# Patient Record
Sex: Female | Born: 1960 | Smoking: Former smoker
Health system: Southern US, Community
[De-identification: ages and names within clinical notes are randomized; demographics above are authoritative.]

## PROBLEM LIST (undated history)

## (undated) DIAGNOSIS — I10 Essential (primary) hypertension: Secondary | ICD-10-CM

## (undated) DIAGNOSIS — E119 Type 2 diabetes mellitus without complications: Secondary | ICD-10-CM

## (undated) DIAGNOSIS — N189 Chronic kidney disease, unspecified: Secondary | ICD-10-CM

## (undated) DIAGNOSIS — I509 Heart failure, unspecified: Secondary | ICD-10-CM

## (undated) HISTORY — DX: Heart failure, unspecified: I50.9

---

## 2020-01-01 ENCOUNTER — Ambulatory Visit: Payer: Self-pay | Attending: Internal Medicine

## 2020-01-01 DIAGNOSIS — Z23 Encounter for immunization: Secondary | ICD-10-CM

## 2020-01-01 NOTE — Progress Notes (Addendum)
   Covid-19 Vaccination Clinic  Name:  Caroline Walls    MRN: [id number] DOB: May 10, 1961  01/01/2020  [name] was observed post Covid-19 immunization for 15 minutes without incident. Vaccine is verified that it was given. She was provided with Vaccine Information Sheet and instruction to access the V-Safe system.   [name] was instructed to call 911 with any severe reactions post vaccine: Marland Kitchen Difficulty breathing  . Swelling of face and throat  . A fast heartbeat  . A bad rash all over body  . Dizziness and weakness   Immunizations Administered    Name Date Dose VIS Date Route   Pfizer COVID-19 Vaccine 01/01/2020 12:50 PM 0.3 mL 09/26/2019 Intramuscular   Manufacturer: [place]   Lot: [id number]   [place]: [id number]

## 2020-01-27 ENCOUNTER — Ambulatory Visit: Payer: Self-pay | Attending: Internal Medicine

## 2020-01-27 DIAGNOSIS — Z23 Encounter for immunization: Secondary | ICD-10-CM

## 2020-01-27 NOTE — Progress Notes (Signed)
   Covid-19 Vaccination Clinic  Name:  Caroline Walls    MRN: [id number] DOB: July 01, 1961  01/27/2020  [name] was observed post Covid-19 immunization for 15 minutes without incident. She was provided with Vaccine Information Sheet and instruction to access the V-Safe system.   [name] was instructed to call 911 with any severe reactions post vaccine: Marland Kitchen Difficulty breathing  . Swelling of face and throat  . A fast heartbeat  . A bad rash all over body  . Dizziness and weakness   Immunizations Administered    Name Date Dose VIS Date Route   Pfizer COVID-19 Vaccine 01/27/2020 12:34 PM 0.3 mL 09/26/2019 Intramuscular   Manufacturer: [place]   Lot: [id number]   [place]: [id number]

## 2020-08-05 ENCOUNTER — Other Ambulatory Visit (HOSPITAL_COMMUNITY): Payer: Self-pay | Admitting: Internal Medicine

## 2020-08-05 ENCOUNTER — Other Ambulatory Visit: Payer: Self-pay | Admitting: Internal Medicine

## 2020-08-05 DIAGNOSIS — R1012 Left upper quadrant pain: Secondary | ICD-10-CM

## 2020-08-16 ENCOUNTER — Ambulatory Visit: Admission: RE | Admit: 2020-08-16 | Payer: PRIVATE HEALTH INSURANCE | Source: Ambulatory Visit

## 2020-08-18 ENCOUNTER — Other Ambulatory Visit: Payer: Self-pay

## 2020-08-18 ENCOUNTER — Ambulatory Visit
Admission: RE | Admit: 2020-08-18 | Discharge: 2020-08-18 | Disposition: A | Discharge status: Home / Self Care | Payer: PRIVATE HEALTH INSURANCE | Source: Ambulatory Visit | Attending: Internal Medicine | Admitting: Internal Medicine

## 2020-08-18 DIAGNOSIS — R1012 Left upper quadrant pain: Secondary | ICD-10-CM | POA: Diagnosis present

## 2020-11-03 ENCOUNTER — Inpatient Hospital Stay
Admission: EM | Admit: 2020-11-03 | Discharge: 2020-11-05 | DRG: 291 | Disposition: A | Discharge status: Home Health | Payer: PRIVATE HEALTH INSURANCE | Attending: Internal Medicine | Admitting: Internal Medicine

## 2020-11-03 ENCOUNTER — Other Ambulatory Visit: Payer: Self-pay

## 2020-11-03 ENCOUNTER — Inpatient Hospital Stay: Payer: PRIVATE HEALTH INSURANCE

## 2020-11-03 ENCOUNTER — Emergency Department: Payer: PRIVATE HEALTH INSURANCE

## 2020-11-03 ENCOUNTER — Encounter: Payer: Self-pay | Admitting: *Deleted

## 2020-11-03 DIAGNOSIS — I248 Other forms of acute ischemic heart disease: Secondary | ICD-10-CM | POA: Diagnosis present

## 2020-11-03 DIAGNOSIS — I5023 Acute on chronic systolic (congestive) heart failure: Secondary | ICD-10-CM | POA: Diagnosis present

## 2020-11-03 DIAGNOSIS — J9600 Acute respiratory failure, unspecified whether with hypoxia or hypercapnia: Secondary | ICD-10-CM | POA: Diagnosis not present

## 2020-11-03 DIAGNOSIS — E785 Hyperlipidemia, unspecified: Secondary | ICD-10-CM | POA: Diagnosis present

## 2020-11-03 DIAGNOSIS — I130 Hypertensive heart and chronic kidney disease with heart failure and stage 1 through stage 4 chronic kidney disease, or unspecified chronic kidney disease: Principal | ICD-10-CM | POA: Diagnosis present

## 2020-11-03 DIAGNOSIS — N184 Chronic kidney disease, stage 4 (severe): Secondary | ICD-10-CM | POA: Diagnosis present

## 2020-11-03 DIAGNOSIS — E279 Disorder of adrenal gland, unspecified: Secondary | ICD-10-CM | POA: Diagnosis present

## 2020-11-03 DIAGNOSIS — Z79899 Other long term (current) drug therapy: Secondary | ICD-10-CM | POA: Diagnosis not present

## 2020-11-03 DIAGNOSIS — J189 Pneumonia, unspecified organism: Secondary | ICD-10-CM

## 2020-11-03 DIAGNOSIS — J9601 Acute respiratory failure with hypoxia: Secondary | ICD-10-CM | POA: Diagnosis present

## 2020-11-03 DIAGNOSIS — Z87891 Personal history of nicotine dependence: Secondary | ICD-10-CM

## 2020-11-03 DIAGNOSIS — R0602 Shortness of breath: Secondary | ICD-10-CM | POA: Diagnosis present

## 2020-11-03 DIAGNOSIS — Z7984 Long term (current) use of oral hypoglycemic drugs: Secondary | ICD-10-CM

## 2020-11-03 DIAGNOSIS — E1122 Type 2 diabetes mellitus with diabetic chronic kidney disease: Secondary | ICD-10-CM | POA: Diagnosis present

## 2020-11-03 DIAGNOSIS — Z20822 Contact with and (suspected) exposure to covid-19: Secondary | ICD-10-CM | POA: Diagnosis present

## 2020-11-03 DIAGNOSIS — I509 Heart failure, unspecified: Secondary | ICD-10-CM

## 2020-11-03 DIAGNOSIS — I10 Essential (primary) hypertension: Secondary | ICD-10-CM | POA: Diagnosis not present

## 2020-11-03 HISTORY — DX: Type 2 diabetes mellitus without complications: E11.9

## 2020-11-03 HISTORY — DX: Chronic kidney disease, unspecified: N18.9

## 2020-11-03 HISTORY — DX: Essential (primary) hypertension: I10

## 2020-11-03 LAB — CBC WITH DIFFERENTIAL/PLATELET
Abs Immature Granulocytes: 0.18 10*3/uL — ABNORMAL HIGH (ref 0.00–0.07)
Basophils Absolute: 0 10*3/uL (ref 0.0–0.1)
Basophils Relative: 0 %
Eosinophils Absolute: 0.2 10*3/uL (ref 0.0–0.5)
Eosinophils Relative: 1 %
HCT: 33.8 % — ABNORMAL LOW (ref 36.0–46.0)
Hemoglobin: 11.2 g/dL — ABNORMAL LOW (ref 12.0–15.0)
Immature Granulocytes: 1 %
Lymphocytes Relative: 12 %
Lymphs Abs: 2.1 10*3/uL (ref 0.7–4.0)
MCH: 29.4 pg (ref 26.0–34.0)
MCHC: 33.1 g/dL (ref 30.0–36.0)
MCV: 88.7 fL (ref 80.0–100.0)
Monocytes Absolute: 1.2 10*3/uL — ABNORMAL HIGH (ref 0.1–1.0)
Monocytes Relative: 7 %
Neutro Abs: 13 10*3/uL — ABNORMAL HIGH (ref 1.7–7.7)
Neutrophils Relative %: 79 %
Platelets: 264 10*3/uL (ref 150–400)
RBC: 3.81 MIL/uL — ABNORMAL LOW (ref 3.87–5.11)
RDW: 15.5 % (ref 11.5–15.5)
WBC: 16.6 10*3/uL — ABNORMAL HIGH (ref 4.0–10.5)
nRBC: 0 % (ref 0.0–0.2)

## 2020-11-03 LAB — BASIC METABOLIC PANEL
Anion gap: 17 — ABNORMAL HIGH (ref 5–15)
BUN: 33 mg/dL — ABNORMAL HIGH (ref 8–23)
CO2: 22 mmol/L (ref 22–32)
Calcium: 8.8 mg/dL — ABNORMAL LOW (ref 8.9–10.3)
Chloride: 104 mmol/L (ref 98–111)
Creatinine, Ser: 2.05 mg/dL — ABNORMAL HIGH (ref 0.44–1.00)
GFR, Estimated: 27 mL/min — ABNORMAL LOW (ref >60)
Glucose, Bld: 228 mg/dL — ABNORMAL HIGH (ref 70–99)
Potassium: 3.7 mmol/L (ref 3.5–5.1)
Sodium: 143 mmol/L (ref 135–145)

## 2020-11-03 LAB — TROPONIN I (HIGH SENSITIVITY)
Troponin I (High Sensitivity): 47 ng/L — ABNORMAL HIGH (ref <18)
Troponin I (High Sensitivity): 93 ng/L — ABNORMAL HIGH (ref <18)
Troponin I (High Sensitivity): 116 ng/L (ref <18)
Troponin I (High Sensitivity): 125 ng/L (ref <18)

## 2020-11-03 LAB — CBG MONITORING, ED
Glucose-Capillary: 288 mg/dL — ABNORMAL HIGH (ref 70–99)
Glucose-Capillary: 202 mg/dL — ABNORMAL HIGH (ref 70–99)

## 2020-11-03 LAB — LACTIC ACID, PLASMA: Lactic Acid, Venous: 1.4 mmol/L (ref 0.5–1.9)

## 2020-11-03 LAB — HEMOGLOBIN A1C
Hgb A1c MFr Bld: 6 % — ABNORMAL HIGH (ref 4.8–5.6)
Mean Plasma Glucose: 125.5 mg/dL

## 2020-11-03 LAB — RESP PANEL BY RT-PCR (FLU A&B, COVID) ARPGX2
Influenza A by PCR: NEGATIVE
Influenza B by PCR: NEGATIVE
SARS Coronavirus 2 by RT PCR: NEGATIVE

## 2020-11-03 LAB — HIV ANTIBODY (ROUTINE TESTING W REFLEX): HIV Screen 4th Generation wRfx: NONREACTIVE

## 2020-11-03 LAB — PROCALCITONIN: Procalcitonin: <0.1 ng/mL

## 2020-11-03 LAB — BRAIN NATRIURETIC PEPTIDE: B Natriuretic Peptide: 919.9 pg/mL — ABNORMAL HIGH (ref 0.0–100.0)

## 2020-11-03 MED ORDER — HYDRALAZINE HCL 50 MG PO TABS
100.0000 mg | ORAL_TABLET | Freq: Two times a day (BID) | ORAL | Status: DC
  Administered 2020-11-03 – 2020-11-04 (×2): 100 mg via ORAL
  Filled 2020-11-03 (×2): qty 2

## 2020-11-03 MED ORDER — BENAZEPRIL HCL 20 MG PO TABS
40.0000 mg | ORAL_TABLET | Freq: Every day | ORAL | Status: DC
  Administered 2020-11-03 – 2020-11-05 (×3): 40 mg via ORAL
  Filled 2020-11-03 (×3): qty 2

## 2020-11-03 MED ORDER — ONDANSETRON HCL 4 MG/2ML IJ SOLN: 4.0000 mg | Freq: Four times a day (QID) | INTRAMUSCULAR | Status: DC | PRN

## 2020-11-03 MED ORDER — SODIUM CHLORIDE 0.9% FLUSH
3.0000 mL | Freq: Two times a day (BID) | INTRAVENOUS | Status: DC
  Administered 2020-11-03 – 2020-11-05 (×4): 3 mL via INTRAVENOUS

## 2020-11-03 MED ORDER — ENOXAPARIN SODIUM 30 MG/0.3ML ~~LOC~~ SOLN
30.0000 mg | SUBCUTANEOUS | Status: DC
  Administered 2020-11-04 – 2020-11-05 (×3): 30 mg via SUBCUTANEOUS
  Filled 2020-11-03 (×6): qty 0.3

## 2020-11-03 MED ORDER — LORAZEPAM 1 MG PO TABS: 1.0000 mg | ORAL_TABLET | Freq: Once | ORAL | Status: DC

## 2020-11-03 MED ORDER — AMLODIPINE BESY-BENAZEPRIL HCL 10-40 MG PO CAPS: 1.0000 | ORAL_CAPSULE | Freq: Every day | ORAL | Status: DC

## 2020-11-03 MED ORDER — SODIUM CHLORIDE 0.9% FLUSH
3.0000 mL | INTRAVENOUS | Status: DC | PRN
  Administered 2020-11-05: 3 mL via INTRAVENOUS

## 2020-11-03 MED ORDER — LORAZEPAM 2 MG/ML IJ SOLN: 1.0000 mg | Freq: Once | INTRAMUSCULAR | Status: DC

## 2020-11-03 MED ORDER — FUROSEMIDE 10 MG/ML IJ SOLN
40.0000 mg | Freq: Once | INTRAMUSCULAR | Status: AC
  Administered 2020-11-03: 40 mg via INTRAVENOUS
  Filled 2020-11-03: qty 4

## 2020-11-03 MED ORDER — INSULIN ASPART 100 UNIT/ML ~~LOC~~ SOLN
0.0000 [IU] | Freq: Three times a day (TID) | SUBCUTANEOUS | Status: DC
  Administered 2020-11-03: 8 [IU] via SUBCUTANEOUS
  Administered 2020-11-03 – 2020-11-04 (×2): 5 [IU] via SUBCUTANEOUS
  Administered 2020-11-04: 2 [IU] via SUBCUTANEOUS
  Administered 2020-11-04 – 2020-11-05 (×3): 3 [IU] via SUBCUTANEOUS
  Filled 2020-11-03 (×7): qty 1

## 2020-11-03 MED ORDER — FUROSEMIDE 10 MG/ML IJ SOLN
40.0000 mg | Freq: Two times a day (BID) | INTRAMUSCULAR | Status: DC
  Administered 2020-11-03 – 2020-11-05 (×4): 40 mg via INTRAVENOUS
  Filled 2020-11-03 (×4): qty 4

## 2020-11-03 MED ORDER — SODIUM CHLORIDE 0.9 % IV SOLN
1.0000 g | Freq: Once | INTRAVENOUS | Status: AC
  Administered 2020-11-03: 1 g via INTRAVENOUS
  Filled 2020-11-03: qty 10

## 2020-11-03 MED ORDER — METOPROLOL SUCCINATE ER 100 MG PO TB24
100.0000 mg | ORAL_TABLET | Freq: Every day | ORAL | Status: DC
  Administered 2020-11-03 – 2020-11-05 (×3): 100 mg via ORAL
  Filled 2020-11-03: qty 1
  Filled 2020-11-03 (×2): qty 2

## 2020-11-03 MED ORDER — SODIUM CHLORIDE 0.9 % IV SOLN: 250.0000 mL | INTRAVENOUS | Status: DC | PRN

## 2020-11-03 MED ORDER — SODIUM CHLORIDE 0.9 % IV SOLN
500.0000 mg | Freq: Once | INTRAVENOUS | Status: AC
  Administered 2020-11-03: 500 mg via INTRAVENOUS
  Filled 2020-11-03: qty 500

## 2020-11-03 MED ORDER — ACETAMINOPHEN 325 MG PO TABS: 650.0000 mg | ORAL_TABLET | ORAL | Status: DC | PRN

## 2020-11-03 NOTE — ED Notes (Signed)
Informed Dr Francine Graven of troponin level 116. MRI on phone with pt.

## 2020-11-03 NOTE — Consult Note (Signed)
CARDIOLOGY CONSULT NOTE               Patient ID: Caroline Walls MRN: [id number] DOB/AGE: 1960-10-27 60 y.o.  Admit date: 11/03/2020 Referring Physician: Collier Bullock, MD  Primary Physician: Gladstone Lighter, MD  Primary Cardiologist: Isaias Cowman, MD  Reason for Consultation: CHF with systolic dysfunction and reduced EF  HPI: Caroline Walls is a 60 year old female with a PMH significant for newly diagnosed systolic CHF(EF of [id number]), HTN, HLD, DM Type II and CKD stage IIIa who presented to [facility] ED via EMS due to complaints of dyspnea. The patient was found to be fluid volume overloaded and cardiology was consulted for CHF management.   ED course: The patient arrived via EMS with c/o dyspnea and peripheral edema. BNP was elevated at 919.9, high sensitivity troponin was elevated at 74>>93, CXR remarkable for cardiomegaly and pulmonary opacification and ECG reveals NSR without evidence of ST changes. The patient was given IV furosemide for diuresis.  The patient reports that the dyspnea started 2 night ago when she noticed that she was "panting" and "gasping" for air during exertion. She reports that she was unable to sleep lying flat due to orthopnea and kept having to sit up in order to breathe effectively. The patient reports an associated symptom of cough with yellow sputum production. Additionally, the patient reports that she frequently experiences BLE edema because she "sits for long periods of time due to work." She reports that she was recently seen by Dr. Saralyn Pilar back in December of 2021 to establish care and for the new diagnosis of CHF. The patient reports that she has been compliant with all of her current medications; however, she has been switched between 2 different diuretics by her PCP because "they weren't working."    Review of systems complete and found to be negative unless listed above     Past Medical History:  Diagnosis Date  . Chronic kidney disease   .  Diabetes mellitus without complication ([place])   . Hypertension     History reviewed. No pertinent surgical history.  (Not in a hospital admission)  Social History   Socioeconomic History  . Marital status: Unknown    Spouse name: Not on file  . Number of children: Not on file  . Years of education: Not on file  . Highest education level: Not on file  Occupational History  . Not on file  Tobacco Use  . Smoking status: Former Research scientist (life sciences)  . Smokeless tobacco: Never Used  Substance and Sexual Activity  . Alcohol use: Not Currently  . Drug use: Not Currently  . Sexual activity: Not Currently  Other Topics Concern  . Not on file  Social History Narrative  . Not on file   Social Determinants of Health   Financial Resource Strain: Not on file  Food Insecurity: Not on file  Transportation Needs: Not on file  Physical Activity: Not on file  Stress: Not on file  Social Connections: Not on file  Intimate Partner Violence: Not on file    Family History  Problem Relation Age of Onset  . Heart failure Brother   . Heart failure Other       Review of systems complete and found to be negative unless listed above      PHYSICAL EXAM  General: Well developed, well nourished, in no acute distress HEENT:  Normocephalic and atraumatic. PERRL Neck:  No JVD.  Lungs: Rhonchi bilaterally to auscultation. Chest expanison symmetrical. Negative for wheezing.  Heart:  HRRR . Normal S1 and S2 without gallops or murmurs.  Abdomen: Bowel sounds are positive, abdomen soft and non-tender  Msk: Normal strength and tone for age. Extremities: +2 pitting BLE edema. No clubbing or cyanosis.   Neuro: Alert and oriented X 3. Psych:  Good affect, responds appropriately  Labs:   Lab Results  Component Value Date   WBC 16.6 (H) 11/03/2020   HGB 11.2 (L) 11/03/2020   HCT 33.8 (L) 11/03/2020   MCV 88.7 11/03/2020   PLT 264 11/03/2020    Recent Labs  Lab 11/03/20 0514  NA 143  K 3.7  CL 104   CO2 22  BUN 33*  CREATININE 2.05*  CALCIUM 8.8*  GLUCOSE 228*   No results found for: CKTOTAL, CKMB, CKMBINDEX, TROPONINI No results found for: CHOL No results found for: HDL No results found for: LDLCALC No results found for: TRIG No results found for: CHOLHDL No results found for: LDLDIRECT    Radiology: CT CHEST WO CONTRAST  Result Date: 11/03/2020 CLINICAL DATA:  Shortness of breath.  Pleural effusions. EXAM: CT CHEST WITHOUT CONTRAST TECHNIQUE: Multidetector CT imaging of the chest was performed following the standard protocol without IV contrast. COMPARISON:  None. FINDINGS: Cardiovascular: No acute findings. Aortic atherosclerotic calcification noted. Mediastinum/Nodes: Diffusely enlarged and heterogeneous appearance of the thyroid gland is demonstrated. This likely represents a multinodular goiter, although thyroid nodules are difficult to evaluate on this unenhanced exam. No other masses or pathologically enlarged lymph nodes identified on this unenhanced exam. Lungs/Pleura: Small bilateral pleural effusions are seen with associated compressive atelectasis in both lung bases. Asymmetric diffuse ground-glass opacity is seen involving the right lung greater than left. This may be due to asymmetric edema or atypical infectious or inflammatory process. No suspicious pulmonary nodules or masses are identified. No evidence of central endobronchial obstruction. Upper Abdomen: Moderate hiatal hernia is seen. A complex lesion is seen in the lateral upper pole of the right kidney which measures 3.3 cm, but cannot be characterized on this unenhanced exam. A probable small left adrenal mass is seen measuring 1.3 cm, also nonspecific. Musculoskeletal:  No suspicious bone lesions. IMPRESSION: Asymmetric diffuse ground-glass opacity in right lung greater than left, which may be due to asymmetric edema, or atypical infectious or inflammatory process. No evidence of pulmonary mass or central endobronchial  obstruction. Small bilateral pleural effusions with bibasilar atelectasis. Moderate hiatal hernia. Diffusely enlarged and heterogeneous appearance of thyroid gland. Recommend thyroid ultrasound. (Ref: J Am Coll Radiol. 2015 Feb;12(2): 143-50). Indeterminate right renal lesion and probable small left adrenal mass. Abdomen MRI without and with contrast is recommended for further characterization. Aortic Atherosclerosis (ICD10-I70.0). Electronically Signed   By: Marlaine Hind M.D.   On: 11/03/2020 09:32   DG Chest Portable 1 View  Result Date: 11/03/2020 CLINICAL DATA:  Shortness of breath EXAM: PORTABLE CHEST 1 VIEW COMPARISON:  None available FINDINGS: Cardiomegaly. Symmetric interstitial opacity with cephalized blood flow and probable small pleural effusions. There is streaky density at both lung bases. Negative aortic and hilar contours IMPRESSION: Cardiomegaly and pulmonary opacification, favor CHF over infection. Electronically Signed   By: Monte Fantasia M.D.   On: 11/03/2020 06:00    EKG: NSR without ST changes  ECHO: (09/01/2020) INTERPRETATION  MODERATE LV SYSTOLIC DYSFUNCTION (See above)  NORMAL RIGHT VENTRICULAR SYSTOLIC FUNCTION  MILD VALVULAR REGURGITATION (See above)  NO VALVULAR STENOSIS  Elevated pulmonary pressure Closest EF: 35% (Estimated)    ASSESSMENT AND PLAN:  Caroline Walls is a 60 year old female with  a PMH significant for newly diagnosed systolic CHF(EF of [id number]), HTN, HLD, DM Type II and CKD stage IIIa who presented to [facility] ED via EMS due to complaints of dyspnea likely due to CHF exacerbation. The patient has an elevated BNP, CXR findings consistent with CHF and elevated high sensitivity troponin which is likely due to demand ischemia. The patient has been on ACEi, beta blocker and diuretic therapy as outpatient and is scheduled to undergo a stress test as an outpatient next month. The patient's compliance with a low sodium diet and medications are questionable, as the patient  was reluctant to start many of the medications that were suggested due to potential side effects that she has researched on her own. Moderate diuresis in warranted at this time given the patient's current symptoms, reduced EF and presence of CKD. Bronchitis could also be a contributing factor to the patient's dyspnea as she was previously treated with Zithromax as a outpatient with minimal resolution of her symptoms.   1. CHF systolic with reduced EF (35%), uncompensated, fairly stable, confirmed by Echocardiogram from 08/2020, BNP elevated  -Agree with admission to Telemetry unit.   -Continue IV diuresis with strict monitoring of I&O's and daily weights.  -ACEi and beta blocker.  -Recommend transitioning to entresto as outpatient OR starting isosorbide therapy along with her current hydralazine.   -Continuous telemetry monitoring.   2. Elevated troponin, likely due to demand ischemia, patient denies any angina at this time, ECG negative for ST changes  -Recommend outpatient Lexiscan/Myoview   -Continuous telemetry monitoring.   -Trend high sensitivity troponin  3. HTN, failry controlled  -Agree with metoprolol, hydralazine and benazipril therapy.  -Start heart healthy, low sodium diet.   -Monitor BP per protocol.  4. HLD, stable  -continue crestor therapy per outpatient medication reconciliation.  5. DM type II, fairly stable  -Agree with management with routine CBG monitoring and sliding scale insulin per protocol.  6. CKD Stage IIIa, fairly stable  -Recommending trending CMP.  -Recommend moderate IV diuresis and consider Nephrology referral if BUN/creatinine worsens.   7. Acute respiratory failure, likely due to CHF exacerbation, fairly stable, patient is on room air with 96% oxygen saturation  -Agree with current management.  - Agree with antibiotic therapy for possible pneumonia.  -Blood cultures pending.     Signed: Burnice Oestreicher ACNPC-AG 11/03/2020, 12:05 PM

## 2020-11-03 NOTE — ED Notes (Signed)
Pt given dose of lasix and disconnected from monitor to walk to toilet.

## 2020-11-03 NOTE — ED Notes (Signed)
Pt to MRI via gurney at this time.

## 2020-11-03 NOTE — ED Notes (Signed)
Lab at bedside

## 2020-11-03 NOTE — ED Notes (Signed)
MD at bedside. 

## 2020-11-03 NOTE — ED Notes (Signed)
PT back from MRI via gurney at this time.  No acute changes or s/s of distress.  PT tolerated well.  Placed back on bedside cardiac monitor/reasses

## 2020-11-03 NOTE — ED Notes (Signed)
CSW at bedside discussing home health.

## 2020-11-03 NOTE — ED Notes (Signed)
This RN stuck for blood cultures x2. Lab contacted to collect and reports unable to come at this time, [employer] aware. Dr Archie Balboa aware. Pt refuses to be stuck again at this time.

## 2020-11-03 NOTE — ED Notes (Signed)
Dr bradler notified of trop 98, no new orders at this time

## 2020-11-03 NOTE — ED Triage Notes (Signed)
Pt to ED via EMS from home reporting SOB x 3 days that worsened tonight. Upon EMS arrival to pts home oxygen saturation on RA was 48%. 1 duoneb, 1 albuterol, '125mg'$  of Solumedrol and NRB in route and pt arrived to ED with an oxygen saturation of 94%. PT able to speak in complete sentences. No CP. Cough when laying down and +1 pitting edema noted to both right and left legs. Pt reporting her PCP has been checking her for CHF recently.

## 2020-11-03 NOTE — H&P (Signed)
History and Physical    Caroline Walls [id number] DOB: 09-01-1961 DOA: 11/03/2020  PCP: Caroline Lighter, MD   Patient coming from: Home  I have personally briefly reviewed patient's old medical records in [place]  Chief Complaint: Shortness of breath  HPI: Caroline Walls is a 60 y.o. 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  Patient has had exertional shortness of breath for about 3 days that worsened the night prior to her presentation.  She has a cough which is occasionally productive of yellow phlegm as well as bilateral lower extremity swelling.  She was seen by her primary care provider who was concerned that she may have CHF and is being worked up for same. Per EMS patient had room air pulse oximetry of 48% and required oxygen supplementation with a nonrebreather mask to maintain pulse oximetry greater than 92%.  She received DuoNeb and Solu-Medrol 125 mg prior to presenting to the ER. She denies having any chest pain, no nausea, no vomiting, no dizziness, no lightheadedness, no abdominal pain, no fever, no chills, no constipation, no diarrhea, no urinary frequency, no nocturia, no dysuria, no headaches or any mental status changes. Labs show sodium 143, potassium 3.7, chloride 104, bicarb 22, glucose 228, BUN 33, creatinine 2.05, BNP 919, troponin  47 >> 93, procalcitonin less than 0.10, white count 16.6, hemoglobin 11.2, hematocrit 33.8, MCV 88, RDW 15.5, platelet count 264 Respiratory viral panel is negative Chest x-ray reviewed by me shows cardiomegaly and pulmonary opacification favor CHF of infection. CT scan of the chest without contrast shows  asymmetric diffuse ground-glass opacity in right lung greater than left, which may be due to asymmetric edema, or atypical infectious or  inflammatory process. No evidence of pulmonary mass or central endobronchial obstruction. Small bilateral pleural effusions with bibasilar atelectasis. Moderate hiatal hernia. Diffusely enlarged and heterogeneous appearance of thyroid gland. Recommend thyroid ultrasound. Indeterminate right renal lesion and probable small left adrenal mass. Abdomen MRI without and with contrast is recommended for further characterization. Aortic Atherosclerosis  Twelve-lead EKG reviewed by me shows normal sinus rhythm with widening of the QRS.   ED Course: Patient is a 60 year old African-American female who presents to the ER via EMS for evaluation of worsening shortness of breath.  She was hypoxic in the field with room air pulse oximetry in the 40s and required a nonrebreather mask to maintain pulse oximetry greater than 92%.  She received Solu-Medrol in the field as well as a dose of Zithromax and Rocephin for presumed pneumonia.  Imaging was suggestive of CHF and patient had a 2D echocardiogram done as an outpatient which showed LVEF of 35%.  She will be admitted to the hospital for further evaluation   Review of Systems: As per HPI otherwise all systems reviewed and negative.   Past Medical History:  Diagnosis Date  . Chronic kidney disease   . Diabetes mellitus without complication ([place])   . Hypertension     History reviewed. No pertinent surgical history.   reports that she has quit smoking. She has never used smokeless tobacco. She reports previous alcohol use. She reports previous drug use.  Allergies  Allergen Reactions  . Penicillins Other (See Comments)    Patient get's a yeast infection every time she takes medication. Other reaction(s): Other (See Comments) Patient get's a yeast infection every time  she takes medication.     Family History  Problem Relation Age of Onset  . Heart failure Brother   . Heart failure Other      Prior to Admission medications   Medication Sig  Start Date End Date Taking? Authorizing Provider  amLODipine-benazepril (LOTREL) 10-40 MG capsule Take 1 capsule by mouth daily. 10/13/20  Yes [provider]  furosemide (LASIX) 40 MG tablet Take 40 mg by mouth daily. 09/03/20  Yes [provider]  glipiZIDE (GLUCOTROL XL) 2.5 MG 24 hr tablet Take 2.5 mg by mouth daily. 10/03/20  Yes [provider]  hydrALAZINE (APRESOLINE) 100 MG tablet Take 100 mg by mouth 2 (two) times daily. 09/19/20  Yes [provider]  meloxicam (MOBIC) 15 MG tablet Take 15 mg by mouth daily as needed. 07/22/20  Yes [provider]  metoprolol succinate (TOPROL-XL) 100 MG 24 hr tablet Take 100 mg by mouth daily. 10/27/20  Yes [provider]    Physical Exam: Vitals:   11/03/20 0510 11/03/20 0600 11/03/20 0630 11/03/20 0800  BP: (!) 157/70 (!) 144/60 (!) 146/66 (!) 165/74  Pulse: 80 82 71 77  Resp: 20 (!) 26 (!) 22   Temp: (!) 97.4 F (36.3 C)     TempSrc: Oral     SpO2: 96% 98% 95% 96%  Weight:      Height:         Vitals:   11/03/20 0510 11/03/20 0600 11/03/20 0630 11/03/20 0800  BP: (!) 157/70 (!) 144/60 (!) 146/66 (!) 165/74  Pulse: 80 82 71 77  Resp: 20 (!) 26 (!) 22   Temp: (!) 97.4 F (36.3 C)     TempSrc: Oral     SpO2: 96% 98% 95% 96%  Weight:      Height:        Constitutional: NAD, alert and oriented x 3 Eyes: PERRL, lids and conjunctivae normal ENMT: Mucous membranes are moist.  Neck: normal, supple, no masses, no thyromegaly Respiratory: Rales at the bases, no wheezing. Normal respiratory effort. No accessory muscle use.  Cardiovascular: Regular rate and rhythm, no murmurs / rubs / gallops. 1+ extremity edema. 2+ pedal pulses. No carotid bruits.  Abdomen: no tenderness, no masses palpated. No hepatosplenomegaly. Bowel sounds positive.  Musculoskeletal: no clubbing / cyanosis. No joint deformity upper and lower extremities.  Skin: no rashes, lesions, ulcers.  Neurologic: No gross  focal neurologic deficit. Psychiatric: Normal mood and affect.   Labs on Admission: I have personally reviewed following labs and imaging studies  CBC: Recent Labs  Lab 11/03/20 0514  WBC 16.6*  NEUTROABS 13.0*  HGB 11.2*  HCT 33.8*  MCV 88.7  PLT [id number]   Basic Metabolic Panel: Recent Labs  Lab 11/03/20 0514  NA 143  K 3.7  CL 104  CO2 22  GLUCOSE 228*  BUN 33*  CREATININE 2.05*  CALCIUM 8.8*   GFR: Estimated Creatinine Clearance: 28 mL/min (A) (by C-G formula based on SCr of 2.05 mg/dL (H)). Liver Function Tests: No results for input(s): AST, ALT, ALKPHOS, BILITOT, PROT, ALBUMIN in the last 168 hours. No results for input(s): LIPASE, AMYLASE in the last 168 hours. No results for input(s): AMMONIA in the last 168 hours. Coagulation Profile: No results for input(s): INR, PROTIME in the last 168 hours. Cardiac Enzymes: No results for input(s): CKTOTAL, CKMB, CKMBINDEX, TROPONINI in the last 168 hours. BNP (last 3 results) No results for input(s): PROBNP in the last 8760 hours. HbA1C: No results for  input(s): HGBA1C in the last 72 hours. CBG: No results for input(s): GLUCAP in the last 168 hours. Lipid Profile: No results for input(s): CHOL, HDL, LDLCALC, TRIG, CHOLHDL, LDLDIRECT in the last 72 hours. Thyroid Function Tests: No results for input(s): TSH, T4TOTAL, FREET4, T3FREE, THYROIDAB in the last 72 hours. Anemia Panel: No results for input(s): VITAMINB12, FOLATE, FERRITIN, TIBC, IRON, RETICCTPCT in the last 72 hours. Urine analysis: No results found for: COLORURINE, APPEARANCEUR, LABSPEC, [place], GLUCOSEU, HGBUR, BILIRUBINUR, KETONESUR, PROTEINUR, UROBILINOGEN, NITRITE, LEUKOCYTESUR  Radiological Exams on Admission: CT CHEST WO CONTRAST  Result Date: 11/03/2020 CLINICAL DATA:  Shortness of breath.  Pleural effusions. EXAM: CT CHEST WITHOUT CONTRAST TECHNIQUE: Multidetector CT imaging of the chest was performed following the standard protocol without IV  contrast. COMPARISON:  None. FINDINGS: Cardiovascular: No acute findings. Aortic atherosclerotic calcification noted. Mediastinum/Nodes: Diffusely enlarged and heterogeneous appearance of the thyroid gland is demonstrated. This likely represents a multinodular goiter, although thyroid nodules are difficult to evaluate on this unenhanced exam. No other masses or pathologically enlarged lymph nodes identified on this unenhanced exam. Lungs/Pleura: Small bilateral pleural effusions are seen with associated compressive atelectasis in both lung bases. Asymmetric diffuse ground-glass opacity is seen involving the right lung greater than left. This may be due to asymmetric edema or atypical infectious or inflammatory process. No suspicious pulmonary nodules or masses are identified. No evidence of central endobronchial obstruction. Upper Abdomen: Moderate hiatal hernia is seen. A complex lesion is seen in the lateral upper pole of the right kidney which measures 3.3 cm, but cannot be characterized on this unenhanced exam. A probable small left adrenal mass is seen measuring 1.3 cm, also nonspecific. Musculoskeletal:  No suspicious bone lesions. IMPRESSION: Asymmetric diffuse ground-glass opacity in right lung greater than left, which may be due to asymmetric edema, or atypical infectious or inflammatory process. No evidence of pulmonary mass or central endobronchial obstruction. Small bilateral pleural effusions with bibasilar atelectasis. Moderate hiatal hernia. Diffusely enlarged and heterogeneous appearance of thyroid gland. Recommend thyroid ultrasound. (Ref: J Am Coll Radiol. 2015 Feb;12(2): 143-50). Indeterminate right renal lesion and probable small left adrenal mass. Abdomen MRI without and with contrast is recommended for further characterization. Aortic Atherosclerosis (ICD10-I70.0). Electronically Signed   By: Marlaine Hind M.D.   On: 11/03/2020 09:32   DG Chest Portable 1 View  Result Date:  11/03/2020 CLINICAL DATA:  Shortness of breath EXAM: PORTABLE CHEST 1 VIEW COMPARISON:  None available FINDINGS: Cardiomegaly. Symmetric interstitial opacity with cephalized blood flow and probable small pleural effusions. There is streaky density at both lung bases. Negative aortic and hilar contours IMPRESSION: Cardiomegaly and pulmonary opacification, favor CHF over infection. Electronically Signed   By: Monte Fantasia M.D.   On: 11/03/2020 06:00    EKG: Independently reviewed.  Sinus rhythm LVH with widening QRS.    Assessment/Plan Principal Problem:   Acute on chronic systolic CHF (congestive heart failure) (HCC) Active Problems:   Acute respiratory failure (HCC)   Type 2 diabetes mellitus with stage 4 chronic kidney disease (HCC)   Essential hypertension      Acute on chronic systolic heart failure Patient was brought into the ER via EMS for evaluation of worsening shortness of breath associated with bilateral lower extremity swelling She had a 2D echocardiogram done on 01/17 as an out patient which showed an LVEF of 35% Place patient on Lasix 40 mg IV every 12 Optimize blood pressure control Continue metoprolol, benazepril and hydralazine    Elevated troponin Most likely secondary  to demand ischemia from acute CHF Patient denies having any chest pain and EKG shows no acute findings We will trend cardiac enzymes Continue aspirin and Lovenox   Acute respiratory failure Most likely secondary to acute CHF Patient was noted to be hypoxic in the field with a room air pulse oximetry in the 40s associated with increased work of breathing and use of accessory muscles She initially required a nonrebreather mask but is currently on 6 L of oxygen via nasal cannula Continue oxygen supplementation to maintain pulse oximetry greater than 92% Wean off oxygen as tolerated    Diabetes mellitus with complications of stage IV chronic kidney disease Patient will need referral to  nephrology as an outpatient Maintain consistent carbohydrate diet Glycemic control with sliding scale insulin Hold glipizide during this hospitalization    Hypertension Continue metoprolol, benazepril and hydralazine    Right renal lesion/adrenal mass MRA with and without contrast was recommended Patient has chronic kidney disease and as such we will obtain MRA without contrast      DVT prophylaxis: Lovenox Code Status: Full code Family Communication: Greater than 50% of time was spent discussing patient's condition and plan of care with her at the bedside.  All questions and concerns have been addressed.  She verbalizes understanding and agrees with the plan. Disposition Plan: Back to previous home environment Consults called: Cardiology    Arlester Keehan MD [facility]     11/03/2020, 10:58 AM

## 2020-11-03 NOTE — ED Provider Notes (Signed)
[facility] Emergency Department Provider Note   ____________________________________________   I have reviewed the triage vital signs and the nursing notes.   HISTORY  Chief Complaint Shortness of Breath   History limited by: Not Limited   HPI Caroline Walls is a 60 y.o. female who presents to the emergency department today because of concerns for shortness of breath.  Patient states that she has darted becoming short of breath yesterday evening.  It progressed throughout the night to the point where it became hard for her to sleep.  The patient has not had any significant associated chest pain.  The patient has had some cough.  She denies any fevers.  Denies any recent covid exposure.    Records reviewed. Per medical record review patient has a history of CHF  History reviewed. No pertinent past medical history.  There are no problems to display for this patient.   History reviewed. No pertinent surgical history.  Prior to Admission medications   Not on File    Allergies Patient has no known allergies.  History reviewed. No pertinent family history.  Social History Social History   Tobacco Use  . Smoking status: Never Smoker  . Smokeless tobacco: Never Used  Substance Use Topics  . Alcohol use: Not Currently  . Drug use: Not Currently    Review of Systems Constitutional: No fever/chills Eyes: No visual changes. ENT: No sore throat. Cardiovascular: Denies chest pain. Respiratory: Positive for shortness of breath. Gastrointestinal: No abdominal pain.  No nausea, no vomiting.  No diarrhea.   Genitourinary: Negative for dysuria. Musculoskeletal: Negative for back pain. Skin: Negative for rash. Neurological: Negative for headaches, focal weakness or numbness.  ____________________________________________   PHYSICAL EXAM:  VITAL SIGNS: ED Triage Vitals  Enc Vitals Group     BP 11/03/20 0510 (!) 157/70     Pulse Rate 11/03/20 0510  80     Resp 11/03/20 0510 20     Temp 11/03/20 0510 (!) 97.4 F (36.3 C)     Temp Source 11/03/20 0510 Oral     SpO2 11/03/20 0510 96 %     Weight 11/03/20 0505 174 lb (78.9 kg)     Height 11/03/20 0505 '5\' 2"'$  (1.575 m)     Head Circumference --      Peak Flow --      Pain Score 11/03/20 0505 0   Constitutional: Alert and oriented.  Eyes: Conjunctivae are normal.  ENT      Head: Normocephalic and atraumatic.      Nose: No congestion/rhinnorhea.      Mouth/Throat: Mucous membranes are moist.      Neck: No stridor. Hematological/Lymphatic/Immunilogical: No cervical lymphadenopathy. Cardiovascular: Normal rate, regular rhythm.  No murmurs, rubs, or gallops.  Respiratory: Normal respiratory effort without tachypnea nor retractions. Breath sounds are clear and equal bilaterally. No wheezes/rales/rhonchi. Gastrointestinal: Soft and non tender. No rebound. No guarding.  Genitourinary: Deferred Musculoskeletal: Normal range of motion in all extremities. No lower extremity edema. Neurologic:  Normal speech and language. No gross focal neurologic deficits are appreciated.  Skin:  Skin is warm, dry and intact. No rash noted. Psychiatric: Mood and affect are normal. Speech and behavior are normal. Patient exhibits appropriate insight and judgment.  ____________________________________________    LABS (pertinent positives/negatives)  CBC wbc 16.6, hgb 11.2, plt 264 Trop hs 47 BMP na 143, k 3.7, glu 228, cr 2.05 ____________________________________________   EKG  I, Nance Pear, attending physician, personally viewed and interpreted this EKG  EKG Time: 0554 Rate: 81 Rhythm: normal sinus rhythm Axis: rightward axis Intervals: qtc 506 QRS: LVH ST changes: no st elevation Impression: abnormal ekg  ____________________________________________    RADIOLOGY  CXR Cardiomegaly and pulmonary  opacification   ____________________________________________   PROCEDURES  Procedures  ____________________________________________   INITIAL IMPRESSION / ASSESSMENT AND PLAN / ED COURSE  Pertinent labs & imaging results that were available during my care of the patient were reviewed by me and considered in my medical decision making (see chart for details).   Patient presented to the emergency department today because of concerns for shortness of breath.  Patient was reported to be quite hypoxic for EMS and came in on nonrebreather.  We were able to switch her however to nasal cannula.  Chest x-ray is concerning for CHF versus infection.  Blood work did show leukocytosis of 16.  COVID was negative.  Will start patient on IV antibiotics possible pneumonia. Will also give lasix for likely some fluid overload as well. Will plan on admission to the hospitalist service.  ____________________________________________   FINAL CLINICAL IMPRESSION(S) / ED DIAGNOSES  Final diagnoses:  SOB (shortness of breath)  Acute on chronic congestive heart failure, unspecified heart failure type ([place])  Pneumonia due to infectious organism, unspecified laterality, unspecified part of lung     Note: This dictation was prepared with Dragon dictation. Any transcriptional errors that result from this process are unintentional     Nance Pear, MD 11/03/20 [phone]

## 2020-11-03 NOTE — ED Notes (Signed)
NP at bedside. Discussed BP and HF meds.  Sent msg to pharmacy re: missing enoxaparin dose.

## 2020-11-03 NOTE — ED Notes (Signed)
Pt removed [place]. Pt SPO2 remains 95-96% on RA.

## 2020-11-03 NOTE — ED Notes (Signed)
Pt ambulated to restroom. 

## 2020-11-04 LAB — BASIC METABOLIC PANEL
Anion gap: 9 (ref 5–15)
BUN: 48 mg/dL — ABNORMAL HIGH (ref 8–23)
CO2: 25 mmol/L (ref 22–32)
Calcium: 8.9 mg/dL (ref 8.9–10.3)
Chloride: 109 mmol/L (ref 98–111)
Creatinine, Ser: 2.06 mg/dL — ABNORMAL HIGH (ref 0.44–1.00)
GFR, Estimated: 27 mL/min — ABNORMAL LOW (ref >60)
Glucose, Bld: 167 mg/dL — ABNORMAL HIGH (ref 70–99)
Potassium: 3.6 mmol/L (ref 3.5–5.1)
Sodium: 143 mmol/L (ref 135–145)

## 2020-11-04 LAB — GLUCOSE, CAPILLARY: Glucose-Capillary: 114 mg/dL — ABNORMAL HIGH (ref 70–99)

## 2020-11-04 LAB — URINALYSIS, COMPLETE (UACMP) WITH MICROSCOPIC
Bacteria, UA: NONE SEEN
Bilirubin Urine: NEGATIVE
Glucose, UA: 50 mg/dL — AB
Hgb urine dipstick: NEGATIVE
Ketones, ur: NEGATIVE mg/dL
Leukocytes,Ua: NEGATIVE
Nitrite: NEGATIVE
Protein, ur: 100 mg/dL — AB
Specific Gravity, Urine: 1.006 (ref 1.005–1.030)
pH: 5 (ref 5.0–8.0)

## 2020-11-04 LAB — CBC
HCT: 29.5 % — ABNORMAL LOW (ref 36.0–46.0)
Hemoglobin: 9.8 g/dL — ABNORMAL LOW (ref 12.0–15.0)
MCH: 29.2 pg (ref 26.0–34.0)
MCHC: 33.2 g/dL (ref 30.0–36.0)
MCV: 87.8 fL (ref 80.0–100.0)
Platelets: 243 10*3/uL (ref 150–400)
RBC: 3.36 MIL/uL — ABNORMAL LOW (ref 3.87–5.11)
RDW: 15.6 % — ABNORMAL HIGH (ref 11.5–15.5)
WBC: 17.5 10*3/uL — ABNORMAL HIGH (ref 4.0–10.5)
nRBC: 0 % (ref 0.0–0.2)

## 2020-11-04 LAB — CBG MONITORING, ED
Glucose-Capillary: 232 mg/dL — ABNORMAL HIGH (ref 70–99)
Glucose-Capillary: 147 mg/dL — ABNORMAL HIGH (ref 70–99)
Glucose-Capillary: 186 mg/dL — ABNORMAL HIGH (ref 70–99)

## 2020-11-04 MED ORDER — POTASSIUM CHLORIDE CRYS ER 20 MEQ PO TBCR
20.0000 meq | EXTENDED_RELEASE_TABLET | Freq: Every day | ORAL | Status: DC
  Administered 2020-11-04 – 2020-11-05 (×2): 20 meq via ORAL
  Filled 2020-11-04 (×2): qty 1

## 2020-11-04 MED ORDER — HYDRALAZINE HCL 50 MG PO TABS
100.0000 mg | ORAL_TABLET | Freq: Three times a day (TID) | ORAL | Status: DC
  Administered 2020-11-04 – 2020-11-05 (×3): 100 mg via ORAL
  Filled 2020-11-04 (×3): qty 2

## 2020-11-04 NOTE — Consult Note (Signed)
   Heart Failure Nurse Navigator Note  HFrEF 35%  She presented to the ED with complaints of worsening SOB and cough.   She also complained of PND and orthopnea and lower extremity edema.  Comorbidities:  Diabetes type 2 Chronic kidney disease stage IV Hypertension Hyperlipidemia   Medications: Benazepril 40 mg daily Lasix 40 mg IV every 12 Hydralazine 100 mg twice a day Metoprolol succinate 100 mg daily Potassium chloride 20 mEq daily   Labs:  Sodium 143, potassium 3.6, chloride 109, CO2 25, BUN 48 up from 33 yesterday, creatinine 2.06, hemoglobin A1c 6.  Brain natruretic peptide 919    Assessment:  General-she is awake and alert lying on a gurney in the emergency room in no acute distress.  HEENT- pupils are equal, good dentition, no JVD.  Cardiac-heart tones of regular rate and rhythm no murmurs rubs or gallops appreciated.  Chest- breath sounds are clear to posterior auscultation.   Abdomen-rounded soft nontender.   Musculoskeletal-lower extremities remain edematous 1-2+ pitting  Neurologic-she is awake and alert speech is clear.  Psych-is pleasant and appropriate makes good eye contact.     Initial visit with patient.  States as early as last week she had noted some PND and orthopnea, had to get up and sit on the couch to get relief.  She does not weigh her self daily.  Discussed the importance of daily weight and recording.  Also discussed diet, she states she has been using [place] pink salt, suggested removing that from her home.  Discussed using herbs and seasoning, or Mrs. Dash.  She states at home she had noticed when she wakes up in the morning she can hardly put her shoes or zip up her boots.  And the swelling worsens as she is at work sitting at her desk even though she has tried to elevate them at times.  Discussed wearing TED hose and placed order for those.   Discussed low-sodium diet and the relationship with restricting her fluids, she  states that she drinks a lot of water throughout the day.  She also voiced an interest in [place].  We will assist her with finding information.  Will continue to follow.  Pricilla Riffle RN, CHFN

## 2020-11-04 NOTE — Progress Notes (Signed)
[facility]  Patient Description: Caroline Walls is a 60 year old female with a PMH significant for newly diagnosed systolic CHF(EF of [id number]), HTN, HLD, DM Type II and CKD stage IIIa who presented to [facility] ED via EMS due to complaints of dyspnea. The patient was found to be fluid volume overloaded and cardiology was consulted for CHF management.   SUBJECTIVE: The patient reports to be doing slightly better on today. She states that both her breathing and BLE edema has improved some overnight. The patient reports that she has been able to ambulate in the room without significant dyspnea; however, she continues to have a congested cough with sputum production.  OBJECTIVE: The patient is sitting on the ED stretcher comfortably. She appears to be slightly hypervolemic, but has improved some overnight. She doesn't appear to be dyspneic and her BLE edema is now only +1 pitting. The patient's blood pressure continues to be elevated; however, she has not been given her morning antihypertensive medications. The patient's urine output has not been documented, therefore, it is difficult to monitor how effective the IV diuresis is. Although the patient appears to be improving, it would be beneficial to see an accurate intake/output over the 24 hour period.   Vitals:   11/03/20 2219 11/04/20 0215 11/04/20 0526 11/04/20 0748  BP: (!) 154/76 (!) 167/83 (!) 162/77 (!) 150/62  Pulse: 69 84 77 72  Resp: '18 17 17 18  '$ Temp:    98.2 F (36.8 C)  TempSrc:    Oral  SpO2: 98% 93% 98% 98%  Weight:      Height:         Intake/Output Summary (Last 24 hours) at 11/04/2020 [id number] Last data filed at 11/03/2020 1045 Gross per 24 hour  Intake 250 ml  Output --  Net 250 ml      PHYSICAL EXAM  General: Well developed, well nourished, in no acute distress HEENT:  Normocephalic and atraumatic. PERRL Neck:   No JVD.  Lungs: Rhonchi bilaterally to auscultation. Chest expanison symmetrical. Negative for wheezing.  Heart: HRRR  . Normal S1 and S2 without gallops or murmurs.  Abdomen: Bowel sounds are positive, abdomen soft and non-tender  Msk: Normal strength and tone for age. Extremities: +2 pitting BLE edema. No clubbing or cyanosis.   Neuro: Alert and oriented X 3. Psych:  Good affect, responds appropriately   LABS: Basic Metabolic Panel: Recent Labs    11/03/20 0514 11/04/20 0441  NA 143 143  K 3.7 3.6  CL 104 109  CO2 22 25  GLUCOSE 228* 167*  BUN 33* 48*  CREATININE 2.05* 2.06*  CALCIUM 8.8* 8.9   Liver Function Tests: No results for input(s): AST, ALT, ALKPHOS, BILITOT, PROT, ALBUMIN in the last 72 hours. No results for input(s): LIPASE, AMYLASE in the last 72 hours. CBC: Recent Labs    11/03/20 0514  WBC 16.6*  NEUTROABS 13.0*  HGB 11.2*  HCT 33.8*  MCV 88.7  PLT 264   Cardiac Enzymes: No results for input(s): CKTOTAL, CKMB, CKMBINDEX, TROPONINI in the last 72 hours. BNP: Invalid input(s): POCBNP D-Dimer: No results for input(s): DDIMER in the last 72 hours. Hemoglobin A1C: Recent Labs    11/03/20 1144  HGBA1C 6.0*   Fasting Lipid Panel: No results for input(s): CHOL, HDL, LDLCALC, TRIG, CHOLHDL, LDLDIRECT in the last 72 hours. Thyroid Function Tests: No results for input(s): TSH, T4TOTAL, T3FREE, THYROIDAB in the last 72 hours.  Invalid input(s): FREET3 Anemia Panel: No results for input(s): VITAMINB12, FOLATE, FERRITIN,  TIBC, IRON, RETICCTPCT in the last 72 hours.  CT CHEST WO CONTRAST  Result Date: 11/03/2020 CLINICAL DATA:  Shortness of breath.  Pleural effusions. EXAM: CT CHEST WITHOUT CONTRAST TECHNIQUE: Multidetector CT imaging of the chest was performed following the standard protocol without IV contrast. COMPARISON:  None. FINDINGS: Cardiovascular: No acute findings. Aortic atherosclerotic calcification noted. Mediastinum/Nodes: Diffusely enlarged and heterogeneous appearance of the thyroid gland is demonstrated. This likely represents a multinodular goiter,  although thyroid nodules are difficult to evaluate on this unenhanced exam. No other masses or pathologically enlarged lymph nodes identified on this unenhanced exam. Lungs/Pleura: Small bilateral pleural effusions are seen with associated compressive atelectasis in both lung bases. Asymmetric diffuse ground-glass opacity is seen involving the right lung greater than left. This may be due to asymmetric edema or atypical infectious or inflammatory process. No suspicious pulmonary nodules or masses are identified. No evidence of central endobronchial obstruction. Upper Abdomen: Moderate hiatal hernia is seen. A complex lesion is seen in the lateral upper pole of the right kidney which measures 3.3 cm, but cannot be characterized on this unenhanced exam. A probable small left adrenal mass is seen measuring 1.3 cm, also nonspecific. Musculoskeletal:  No suspicious bone lesions. IMPRESSION: Asymmetric diffuse ground-glass opacity in right lung greater than left, which may be due to asymmetric edema, or atypical infectious or inflammatory process. No evidence of pulmonary mass or central endobronchial obstruction. Small bilateral pleural effusions with bibasilar atelectasis. Moderate hiatal hernia. Diffusely enlarged and heterogeneous appearance of thyroid gland. Recommend thyroid ultrasound. (Ref: J Am Coll Radiol. 2015 Feb;12(2): 143-50). Indeterminate right renal lesion and probable small left adrenal mass. Abdomen MRI without and with contrast is recommended for further characterization. Aortic Atherosclerosis (ICD10-I70.0). Electronically Signed   By: Marlaine Hind M.D.   On: 11/03/2020 09:32   MR ABDOMEN WO CONTRAST  Result Date: 11/04/2020 CLINICAL DATA:  60 year old female with history of shortness of breath. Complex lesion in the upper pole of the right kidney and left adrenal lesion noted on recent chest CT. Follow-up study. EXAM: MRI ABDOMEN WITHOUT CONTRAST TECHNIQUE: Multiplanar multisequence MR imaging  was performed without the administration of intravenous contrast. COMPARISON:  No prior abdominal MRI. Chest CT 11/03/2020. CT the abdomen and pelvis 08/18/2020. FINDINGS: Comment: Today's study is limited for detection and characterization of visceral and/or vascular lesions by lack of IV gadolinium. In addition, several of the pulse sequences are limited by patient respiratory motion. Lower chest: Cardiomegaly. Small amount of T2 signal intensity lying dependently in the thorax bilaterally, indicative of small bilateral pleural effusions. Large hiatal hernia. Hepatobiliary: No discrete cystic or solid hepatic lesions are confidently identified on today's noncontrast examination. No intra or extrahepatic biliary ductal dilatation. Gallbladder is unremarkable in appearance. Pancreas: No definite pancreatic mass or peripancreatic fluid collections or inflammatory changes noted on today's noncontrast examination. No pancreatic ductal dilatation. Spleen:  Unremarkable. Adrenals/Urinary Tract: Unfortunately, assessment of renal lesions is severely compromised by patient respiratory motion, and lack of IV gadolinium. With these limitations in mind, there are multiple T1 hypointense and T2 hyperintense lesions which appear to represent cysts, largest of which is in the anterior aspect of the interpolar region of the left kidney measuring 2.0 cm in diameter. The lesion of concern in the upper pole of the right kidney appears to be simple measuring 1.9 cm in diameter. No hydroureteronephrosis in the visualized portions of the abdomen. Mild bilateral adrenal form thickening without discrete adrenal mass. Stomach/Bowel: Visualized portions are unremarkable. Vascular/Lymphatic: No aneurysm identified  in the visualized abdominal vasculature. No lymphadenopathy noted in the abdomen. Other: No significant volume of ascites in the visualized portions of the peritoneal cavity. Musculoskeletal: No aggressive appearing osseous  lesions are noted in the visualized portions of the skeleton. IMPRESSION: 1. Limited examination demonstrating what appear to be multiple simple cysts in the kidneys bilaterally. These are technically incompletely characterized on today's noncontrast study. Further evaluation with abdominal MRI with and without IV gadolinium could be obtained to provide definitive characterization in the future if clinically appropriate. 2. No adrenal mass identified. 3. Cardiomegaly. 4. Small bilateral pleural effusions. 5. Large hiatal hernia. Electronically Signed   By: Vinnie Langton M.D.   On: 11/04/2020 08:00   DG Chest Portable 1 View  Result Date: 11/03/2020 CLINICAL DATA:  Shortness of breath EXAM: PORTABLE CHEST 1 VIEW COMPARISON:  None available FINDINGS: Cardiomegaly. Symmetric interstitial opacity with cephalized blood flow and probable small pleural effusions. There is streaky density at both lung bases. Negative aortic and hilar contours IMPRESSION: Cardiomegaly and pulmonary opacification, favor CHF over infection. Electronically Signed   By: Monte Fantasia M.D.   On: 11/03/2020 06:00     ECHO: (09/01/2020) INTERPRETATION  MODERATE LV SYSTOLIC DYSFUNCTION (See above)  NORMAL RIGHT VENTRICULAR SYSTOLIC FUNCTION  MILD VALVULAR REGURGITATION (See above)  NO VALVULAR STENOSIS  Elevated pulmonary pressure Closest EF: 35% (Estimated)   ASSESSMENT AND PLAN:  Principal Problem:   Acute on chronic systolic CHF (congestive heart failure) (HCC) Active Problems:   Acute respiratory failure (HCC)   Type 2 diabetes mellitus with stage 4 chronic kidney disease ([place])   Essential hypertension    1. CHF systolic with reduced EF (35%), uncompensated, fairly stable, confirmed by Echocardiogram from 08/2020, BNP elevated             -Agree with admission to Telemetry unit.              -Continue IV diuresis with strict monitoring of I&O's and daily weights.  -We will add oral potassium to prevent  hypokalemia as the patient's level is 3.6 on today.              -continue ACEi and beta blocker.             -Recommend transitioning to entresto as outpatient OR starting isosorbide therapy along with her current hydralazine.              -Continuous telemetry monitoring.   2. Elevated troponin, likely due to demand ischemia, patient denies any angina at this time, ECG negative for ST changes  -high sensitivity troponin= 47>>93>>116>>125             -Recommend outpatient Lexiscan/Myoview.                     -Continuous telemetry monitoring.              -Trend high sensitivity troponin  3. HTN, failry controlled             -Agree with metoprolol, hydralazine and benazipril therapy.             -Start heart healthy, low sodium diet.              -Monitor BP per protocol.  4. HLD, stable             -continue crestor therapy per outpatient medication reconciliation.  5. DM type II, fairly stable             -  Agree with management with routine CBG monitoring and sliding scale insulin per protocol.  6. CKD Stage IIIa, fairly stable             -Recommending trending CMP.             -Recommend moderate IV diuresis and consider Nephrology referral if BUN/creatinine worsens.   7. Acute respiratory failure, likely due to CHF exacerbation, fairly stable, patient is on room air with 96% oxygen saturation             -Agree with current management.             - Agree with antibiotic therapy for possible pneumonia.             -Blood cultures pending.    [place], [id number]-AG  11/04/2020 8:28 AM

## 2020-11-04 NOTE — Progress Notes (Signed)
PROGRESS NOTE    Caroline Walls  [id number] DOB: [birth year] DOA: 11/03/2020 PCP: Gladstone Lighter, MD    Brief Narrative:  Caroline Walls is a 60 y.o. 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  Patient has had exertional shortness of breath for about 3 days that worsened the night prior to her presentation.  She has a cough which is occasionally productive of yellow phlegm as well as bilateral lower extremity swelling.  She was seen by her primary care provider who was concerned that she may have CHF and is being worked up for same. Per EMS patient had room air pulse oximetry of 48% and required oxygen supplementation with a nonrebreather mask to maintain pulse oximetry greater than 92%.  She received DuoNeb and Solu-Medrol 125 mg prior to presenting to the ER.  1/20-less sob. Off o2, on RA  Consultants:   cardiology  Procedures:   Antimicrobials:       Subjective:  feels less sob. No cp  Objective: Vitals:   11/03/20 2219 11/04/20 0215 11/04/20 0526 11/04/20 0748  BP: (!) 154/76 (!) 167/83 (!) 162/77 (!) 150/62  Pulse: 69 84 77 72  Resp: '18 17 17 18  '$ Temp:    98.2 F (36.8 C)  TempSrc:    Oral  SpO2: 98% 93% 98% 98%  Weight:      Height:        Intake/Output Summary (Last 24 hours) at 11/04/2020 0845 Last data filed at 11/03/2020 1045 Gross per 24 hour  Intake 250 ml  Output --  Net 250 ml   Filed Weights   11/03/20 0505  Weight: 78.9 kg    Examination:  General exam: Appears calm and comfortable  Respiratory system: scattered fine rales b/l , no wheezing Cardiovascular system: S1 & S2 heard, RRR. No JVD, murmurs, rubs, gallops or clicks.  Gastrointestinal system: Abdomen is nondistended, soft and nontender. Normal bowel sounds heard. Central nervous system: Alert and  oriented. No focal neurological deficits. Extremities: +1 pitting edema Skin:warm, dry Psychiatry: Judgement and insight appear normal. Mood & affect appropriate.     Data Reviewed: I have personally reviewed following labs and imaging studies  CBC: Recent Labs  Lab 11/03/20 0514  WBC 16.6*  NEUTROABS 13.0*  HGB 11.2*  HCT 33.8*  MCV 88.7  PLT [id number]   Basic Metabolic Panel: Recent Labs  Lab 11/03/20 0514 11/04/20 0441  NA 143 143  K 3.7 3.6  CL 104 109  CO2 22 25  GLUCOSE 228* 167*  BUN 33* 48*  CREATININE 2.05* 2.06*  CALCIUM 8.8* 8.9   GFR: Estimated Creatinine Clearance: 27.9 mL/min (A) (by C-G formula based on SCr of 2.06 mg/dL (H)). Liver Function Tests: No results for input(s): AST, ALT, ALKPHOS, BILITOT, PROT, ALBUMIN in the last 168 hours. No results for input(s): LIPASE, AMYLASE in the last 168 hours. No results for input(s): AMMONIA in the last 168 hours. Coagulation Profile: No results for input(s): INR, PROTIME in the last 168 hours. Cardiac Enzymes: No results for input(s): CKTOTAL, CKMB, CKMBINDEX, TROPONINI in the last 168 hours. BNP (last 3 results) No results for input(s): PROBNP in the last 8760 hours. HbA1C: Recent Labs    11/03/20 1144  HGBA1C 6.0*   CBG: Recent Labs  Lab 11/03/20 1157 11/03/20 1736 11/04/20 0736  GLUCAP 288* 202* 232*  Lipid Profile: No results for input(s): CHOL, HDL, LDLCALC, TRIG, CHOLHDL, LDLDIRECT in the last 72 hours. Thyroid Function Tests: No results for input(s): TSH, T4TOTAL, FREET4, T3FREE, THYROIDAB in the last 72 hours. Anemia Panel: No results for input(s): VITAMINB12, FOLATE, FERRITIN, TIBC, IRON, RETICCTPCT in the last 72 hours. Sepsis Labs: Recent Labs  Lab 11/03/20 0514 11/03/20 0839  PROCALCITON <0.10  --   LATICACIDVEN  --  1.4    Recent Results (from the past 240 hour(s))  Resp Panel by RT-PCR (Flu A&B, Covid) Nasopharyngeal Swab     Status: None   Collection Time: 11/03/20  5:54 AM    Specimen: Nasopharyngeal Swab; Nasopharyngeal(NP) swabs in vial transport medium  Result Value Ref Range Status   SARS Coronavirus 2 by RT PCR NEGATIVE NEGATIVE Final    Comment: (NOTE) SARS-CoV-2 target nucleic acids are NOT DETECTED.  The SARS-CoV-2 RNA is generally detectable in upper respiratory specimens during the acute phase of infection. The lowest concentration of SARS-CoV-2 viral copies this assay can detect is 138 copies/mL. A negative result does not preclude SARS-Cov-2 infection and should not be used as the sole basis for treatment or other patient management decisions. A negative result may occur with  improper specimen collection/handling, submission of specimen other than nasopharyngeal swab, presence of viral mutation(s) within the areas targeted by this assay, and inadequate number of viral copies(<138 copies/mL). A negative result must be combined with clinical observations, patient history, and epidemiological information. The expected result is Negative.  Fact Sheet for Patients:  EntrepreneurPulse.com.au  Fact Sheet for Healthcare Providers:  IncredibleEmployment.be  This test is no t yet approved or cleared by the [place] FDA and  has been authorized for detection and/or diagnosis of SARS-CoV-2 by FDA under an Emergency Use Authorization (EUA). This EUA will remain  in effect (meaning this test can be used) for the duration of the COVID-19 declaration under Section 564(b)(1) of the Act, 21 U.S.C.section 360bbb-3(b)(1), unless the authorization is terminated  or revoked sooner.       Influenza A by PCR NEGATIVE NEGATIVE Final   Influenza B by PCR NEGATIVE NEGATIVE Final    Comment: (NOTE) The Xpert Xpress SARS-CoV-2/FLU/RSV plus assay is intended as an aid in the diagnosis of influenza from Nasopharyngeal swab specimens and should not be used as a sole basis for treatment. Nasal washings and aspirates are  unacceptable for Xpert Xpress SARS-CoV-2/FLU/RSV testing.  Fact Sheet for Patients: EntrepreneurPulse.com.au  Fact Sheet for Healthcare Providers: IncredibleEmployment.be  This test is not yet approved or cleared by the [place] FDA and has been authorized for detection and/or diagnosis of SARS-CoV-2 by FDA under an Emergency Use Authorization (EUA). This EUA will remain in effect (meaning this test can be used) for the duration of the COVID-19 declaration under Section 564(b)(1) of the Act, 21 U.S.C. section 360bbb-3(b)(1), unless the authorization is terminated or revoked.  Performed at [facility], [address] [place], [place] [address]   Culture, blood (Routine X 2) w Reflex to ID Panel     Status: None (Preliminary result)   Collection Time: 11/03/20  8:40 AM   Specimen: BLOOD  Result Value Ref Range Status   Specimen Description BLOOD RIGHT [facility]  Final   Special Requests   Final    BOTTLES DRAWN AEROBIC AND ANAEROBIC Blood Culture adequate volume   Culture   Final    NO GROWTH < 24 HOURS Performed at [facility], [address], [place], [place] [address]  Report Status PENDING  Incomplete  Culture, blood (Routine X 2) w Reflex to ID Panel     Status: None (Preliminary result)   Collection Time: 11/03/20  8:44 AM   Specimen: BLOOD  Result Value Ref Range Status   Specimen Description BLOOD RIGHT HAND  Final   Special Requests   Final    BOTTLES DRAWN AEROBIC AND ANAEROBIC Blood Culture adequate volume   Culture   Final    NO GROWTH < 24 HOURS Performed at [facility], [address] [place], [place] [address]    Report Status PENDING  Incomplete         Radiology Studies: CT CHEST WO CONTRAST  Result Date: 11/03/2020 CLINICAL DATA:  Shortness of breath.  Pleural effusions. EXAM: CT CHEST WITHOUT CONTRAST TECHNIQUE: Multidetector CT imaging of the chest was performed following the  standard protocol without IV contrast. COMPARISON:  None. FINDINGS: Cardiovascular: No acute findings. Aortic atherosclerotic calcification noted. Mediastinum/Nodes: Diffusely enlarged and heterogeneous appearance of the thyroid gland is demonstrated. This likely represents a multinodular goiter, although thyroid nodules are difficult to evaluate on this unenhanced exam. No other masses or pathologically enlarged lymph nodes identified on this unenhanced exam. Lungs/Pleura: Small bilateral pleural effusions are seen with associated compressive atelectasis in both lung bases. Asymmetric diffuse ground-glass opacity is seen involving the right lung greater than left. This may be due to asymmetric edema or atypical infectious or inflammatory process. No suspicious pulmonary nodules or masses are identified. No evidence of central endobronchial obstruction. Upper Abdomen: Moderate hiatal hernia is seen. A complex lesion is seen in the lateral upper pole of the right kidney which measures 3.3 cm, but cannot be characterized on this unenhanced exam. A probable small left adrenal mass is seen measuring 1.3 cm, also nonspecific. Musculoskeletal:  No suspicious bone lesions. IMPRESSION: Asymmetric diffuse ground-glass opacity in right lung greater than left, which may be due to asymmetric edema, or atypical infectious or inflammatory process. No evidence of pulmonary mass or central endobronchial obstruction. Small bilateral pleural effusions with bibasilar atelectasis. Moderate hiatal hernia. Diffusely enlarged and heterogeneous appearance of thyroid gland. Recommend thyroid ultrasound. (Ref: J Am Coll Radiol. 2015 Feb;12(2): 143-50). Indeterminate right renal lesion and probable small left adrenal mass. Abdomen MRI without and with contrast is recommended for further characterization. Aortic Atherosclerosis (ICD10-I70.0). Electronically Signed   By: Marlaine Hind M.D.   On: 11/03/2020 09:32   MR ABDOMEN WO  CONTRAST  Result Date: 11/04/2020 CLINICAL DATA:  60 year old female with history of shortness of breath. Complex lesion in the upper pole of the right kidney and left adrenal lesion noted on recent chest CT. Follow-up study. EXAM: MRI ABDOMEN WITHOUT CONTRAST TECHNIQUE: Multiplanar multisequence MR imaging was performed without the administration of intravenous contrast. COMPARISON:  No prior abdominal MRI. Chest CT 11/03/2020. CT the abdomen and pelvis 08/18/2020. FINDINGS: Comment: Today's study is limited for detection and characterization of visceral and/or vascular lesions by lack of IV gadolinium. In addition, several of the pulse sequences are limited by patient respiratory motion. Lower chest: Cardiomegaly. Small amount of T2 signal intensity lying dependently in the thorax bilaterally, indicative of small bilateral pleural effusions. Large hiatal hernia. Hepatobiliary: No discrete cystic or solid hepatic lesions are confidently identified on today's noncontrast examination. No intra or extrahepatic biliary ductal dilatation. Gallbladder is unremarkable in appearance. Pancreas: No definite pancreatic mass or peripancreatic fluid collections or inflammatory changes noted on today's noncontrast examination. No pancreatic ductal dilatation. Spleen:  Unremarkable. Adrenals/Urinary Tract:  Unfortunately, assessment of renal lesions is severely compromised by patient respiratory motion, and lack of IV gadolinium. With these limitations in mind, there are multiple T1 hypointense and T2 hyperintense lesions which appear to represent cysts, largest of which is in the anterior aspect of the interpolar region of the left kidney measuring 2.0 cm in diameter. The lesion of concern in the upper pole of the right kidney appears to be simple measuring 1.9 cm in diameter. No hydroureteronephrosis in the visualized portions of the abdomen. Mild bilateral adrenal form thickening without discrete adrenal mass. Stomach/Bowel:  Visualized portions are unremarkable. Vascular/Lymphatic: No aneurysm identified in the visualized abdominal vasculature. No lymphadenopathy noted in the abdomen. Other: No significant volume of ascites in the visualized portions of the peritoneal cavity. Musculoskeletal: No aggressive appearing osseous lesions are noted in the visualized portions of the skeleton. IMPRESSION: 1. Limited examination demonstrating what appear to be multiple simple cysts in the kidneys bilaterally. These are technically incompletely characterized on today's noncontrast study. Further evaluation with abdominal MRI with and without IV gadolinium could be obtained to provide definitive characterization in the future if clinically appropriate. 2. No adrenal mass identified. 3. Cardiomegaly. 4. Small bilateral pleural effusions. 5. Large hiatal hernia. Electronically Signed   By: Vinnie Langton M.D.   On: 11/04/2020 08:00   DG Chest Portable 1 View  Result Date: 11/03/2020 CLINICAL DATA:  Shortness of breath EXAM: PORTABLE CHEST 1 VIEW COMPARISON:  None available FINDINGS: Cardiomegaly. Symmetric interstitial opacity with cephalized blood flow and probable small pleural effusions. There is streaky density at both lung bases. Negative aortic and hilar contours IMPRESSION: Cardiomegaly and pulmonary opacification, favor CHF over infection. Electronically Signed   By: Monte Fantasia M.D.   On: 11/03/2020 06:00        Scheduled Meds: . benazepril  40 mg Oral Daily  . enoxaparin (LOVENOX) injection  30 mg Subcutaneous Q24H  . furosemide  40 mg Intravenous Q12H  . hydrALAZINE  100 mg Oral BID  . insulin aspart  0-15 Units Subcutaneous TID WC  . LORazepam  1 mg Oral Once   Or  . LORazepam  1 mg Intramuscular Once  . metoprolol succinate  100 mg Oral Daily  . potassium chloride SA  20 mEq Oral Daily  . sodium chloride flush  3 mL Intravenous Q12H   Continuous Infusions: . sodium chloride      Assessment & Plan:    Principal Problem:   Acute on chronic systolic CHF (congestive heart failure) (HCC) Active Problems:   Acute respiratory failure (HCC)   Type 2 diabetes mellitus with stage 4 chronic kidney disease (HCC)   Essential hypertension   Acute on chronic systolic heart failure Patient was brought into the ER via EMS for evaluation of worsening shortness of breath associated with bilateral lower extremity swelling She had a 2D echocardiogram done on 01/17 as an out patient which showed an LVEF of 35% 1/20-cards following Continue Lasix IV twice daily Continue metoprolol inhibitors Recommend transitioning to [facility] as outpatient If need to can initiate isosorbide therapy     Elevated troponin-Most likely secondary to demand ischemia from acute CHF EKG with no acute findings  Continue telemetry       Acute respiratory failure Most likely secondary to acute CHF Patient was noted to be hypoxic in the field with a room air pulse oximetry in the 40s associated with increased work of breathing and use of accessory muscles She initially required a nonrebreather mask but is  currently on 6 L of oxygen via nasal cannula Continue oxygen supplementation to maintain pulse oximetry greater than 92% 1/21-weaned off on RA and stable.    Diabetes mellitus with complications of stage IV chronic kidney disease referal to nephrology as outpatient  Carb control  R ISS  Hold glipizide during this hospitalization      Hypertension Needs improvement  Will increase hydralazine to 100 3 times daily  Continue metoprolol and BenzePrO      Right renal lesion/adrenal mass MRA with and without contrast was recommended Patient has chronic kidney disease and as such we will obtain MRA without contrast       DVT prophylaxis: lovenox Code Status:full Family Communication: aunt updated   Status is: Inpatient  Remains inpatient appropriate because:IV treatments appropriate  due to intensity of illness or inability to take PO   Dispo: The patient is from: Home              Anticipated d/c is to: Home              Anticipated d/c date is: 2 days              Patient currently is not medically stable to d/c.            LOS: 1 day   Time spent: 35 min with >50% on coc    Nolberto Hanlon, MD [facility] Pager [phone]  If 7PM-7AM, please contact night-coverage 11/04/2020, 8:45 AM

## 2020-11-04 NOTE — TOC Initial Note (Signed)
Transition of Care [facility]) - Initial/Assessment Note    Patient Details  Name: Caroline Walls MRN: [id number] Date of Birth: Jun 13, 1961  [facility]) CM/SW Contact:    Ova Freshwater Phone Number: [phone] 11/04/2020, 7:50 AM  Clinical Narrative:                  Patient presents to [facility] ED due to SOB.  Patient stated she is COVID vaccinated and booster. Patient's main contact is Elvin So Engineer, petroleum) [phone] [facility]). CSW explained to patient role of TOC in patient care.  Patient stated she lives at home alone and works.  Patient stated she is able to perform most ADLs but does feel weak when she stands for too long.  Patient stated she does not use a cane or walker and is able to drive herself to her doctor's appointments. Patient is able to get all of her prescriptions and take them as prescribed.  Patient's PCP is Dr. Tressia Miners.   Patient stated she did not want to go to a SNF, she would like to return home with home health because, "I need to get back to work."   Expected Discharge Plan: [place] Barriers to Discharge: Continued Medical Work up   Patient Goals and CMS Choice Patient states their goals for this hospitalization and ongoing recovery are:: "I can't go to a rehab facility because I need to go back to work."      Expected Discharge Plan and Services Expected Discharge Plan: [employer] In-house Referral: Clinical Social Work   Post Acute Care Choice: [place] arrangements for the past 2 months: [place]                                      Prior Living Arrangements/Services Living arrangements for the past 2 months: Single Family Home Lives with:: Self Patient language and need for interpreter reviewed:: Yes Do you feel safe going back to the place where you live?: Yes      Need for Family Participation in Patient Care: No (Comment) Care giver support system in place?: No  (comment)   Criminal Activity/Legal Involvement Pertinent to Current Situation/Hospitalization: No - Comment as needed  Activities of Daily Living      Permission Sought/Granted Permission sought to share information with : Facility Sport and exercise psychologist Permission granted to share information with : Yes, Verbal Permission Granted  Share Information with NAME: Elvin So [phone])   [phone] (Mobile)           Emotional Assessment Appearance:: Appears stated age Attitude/Demeanor/Rapport: Guarded Affect (typically observed): Stable Orientation: : Oriented to Self,Oriented to Place,Oriented to  Time,Oriented to Situation Alcohol / Substance Use: Not Applicable Psych Involvement: No (comment)  Admission diagnosis:  Acute on chronic systolic CHF (congestive heart failure) ([place]) [I50.23] Patient Active Problem List   Diagnosis Date Noted  . Acute on chronic systolic CHF (congestive heart failure) ([place]) 11/03/2020  . Acute respiratory failure ([place]) 11/03/2020  . Type 2 diabetes mellitus with stage 4 chronic kidney disease ([place]) 11/03/2020  . Essential hypertension 11/03/2020   PCP:  Gladstone Lighter, MD Pharmacy:   [id number] [id number]: [phone]: [phone]    Social Determinants of Health (SDOH) Interventions    Readmission Risk Interventions No flowsheet data found.

## 2020-11-04 NOTE — ED Notes (Addendum)
Pt expressing concern regarding not preceiving her home dose of amlodipine since being in the hospital.  Stark Klein, NP made aware of pt's concern via secure chat.

## 2020-11-05 LAB — BASIC METABOLIC PANEL
Anion gap: 11 (ref 5–15)
BUN: 57 mg/dL — ABNORMAL HIGH (ref 8–23)
CO2: 24 mmol/L (ref 22–32)
Calcium: 9.3 mg/dL (ref 8.9–10.3)
Chloride: 107 mmol/L (ref 98–111)
Creatinine, Ser: 2.22 mg/dL — ABNORMAL HIGH (ref 0.44–1.00)
GFR, Estimated: 25 mL/min — ABNORMAL LOW (ref >60)
Glucose, Bld: 111 mg/dL — ABNORMAL HIGH (ref 70–99)
Potassium: 3.4 mmol/L — ABNORMAL LOW (ref 3.5–5.1)
Sodium: 142 mmol/L (ref 135–145)

## 2020-11-05 LAB — CBC
HCT: 30 % — ABNORMAL LOW (ref 36.0–46.0)
Hemoglobin: 10.1 g/dL — ABNORMAL LOW (ref 12.0–15.0)
MCH: 29.4 pg (ref 26.0–34.0)
MCHC: 33.7 g/dL (ref 30.0–36.0)
MCV: 87.5 fL (ref 80.0–100.0)
Platelets: 261 10*3/uL (ref 150–400)
RBC: 3.43 MIL/uL — ABNORMAL LOW (ref 3.87–5.11)
RDW: 15.8 % — ABNORMAL HIGH (ref 11.5–15.5)
WBC: 13.4 10*3/uL — ABNORMAL HIGH (ref 4.0–10.5)
nRBC: 0 % (ref 0.0–0.2)

## 2020-11-05 LAB — GLUCOSE, CAPILLARY
Glucose-Capillary: 101 mg/dL — ABNORMAL HIGH (ref 70–99)
Glucose-Capillary: 158 mg/dL — ABNORMAL HIGH (ref 70–99)
Glucose-Capillary: 160 mg/dL — ABNORMAL HIGH (ref 70–99)

## 2020-11-05 LAB — BRAIN NATRIURETIC PEPTIDE: B Natriuretic Peptide: 983.1 pg/mL — ABNORMAL HIGH (ref 0.0–100.0)

## 2020-11-05 MED ORDER — POTASSIUM CHLORIDE CRYS ER 20 MEQ PO TBCR: 20.0000 meq | EXTENDED_RELEASE_TABLET | Freq: Every day | ORAL | 0 refills | Status: AC

## 2020-11-05 MED ORDER — ISOSORBIDE MONONITRATE ER 30 MG PO TB24: 30.0000 mg | ORAL_TABLET | Freq: Every day | ORAL | 0 refills | Status: DC

## 2020-11-05 MED ORDER — HYDRALAZINE HCL 100 MG PO TABS: 100.0000 mg | ORAL_TABLET | Freq: Three times a day (TID) | ORAL | 0 refills | Status: AC

## 2020-11-05 MED ORDER — ISOSORBIDE MONONITRATE ER 30 MG PO TB24
30.0000 mg | ORAL_TABLET | Freq: Every day | ORAL | Status: DC
  Administered 2020-11-05: 30 mg via ORAL
  Filled 2020-11-05: qty 1

## 2020-11-05 MED ORDER — BENAZEPRIL HCL 40 MG PO TABS: 40.0000 mg | ORAL_TABLET | Freq: Every day | ORAL | 0 refills | Status: DC

## 2020-11-05 MED ORDER — POTASSIUM CHLORIDE CRYS ER 20 MEQ PO TBCR
20.0000 meq | EXTENDED_RELEASE_TABLET | Freq: Once | ORAL | Status: AC
  Administered 2020-11-05: 20 meq via ORAL
  Filled 2020-11-05: qty 1

## 2020-11-05 MED ORDER — FUROSEMIDE 10 MG/ML IJ SOLN: 40.0000 mg | Freq: Every day | INTRAMUSCULAR | Status: DC

## 2020-11-05 NOTE — Progress Notes (Signed)
[facility] Encounter Note  Patient: Caroline Walls / Admit Date: 11/03/2020 / Date of Encounter: 11/05/2020, 12:25 PM   Subjective: Patient is doing well today.  Ambulating well throughout her room without evidence of significant concerns.  The patient has tolerated her furosemide intravenously and has had significant urine output over the last few days.  Metoprolol benazepril have been added to her regimen and she has tolerated well.  Current telemetry shows normal sinus rhythm  Review of Systems: Positive for: None Negative for: Vision change, hearing change, syncope, dizziness, nausea, vomiting,diarrhea, bloody stool, stomach pain, cough, congestion, diaphoresis, urinary frequency, urinary pain,skin lesions, skin rashes Others previously listed  Objective: Telemetry: Normal sinus rhythm Physical Exam: Blood pressure (!) 167/83, pulse 71, temperature 98.5 F (36.9 C), temperature source Oral, resp. rate 16, height '5\' 2"'$  (1.575 m), weight 79.3 kg, SpO2 94 %. Body mass index is 31.97 kg/m. General: Well developed, well nourished, in no acute distress. Head: Normocephalic, atraumatic, sclera non-icteric, no xanthomas, nares are without discharge. Neck: No apparent masses Lungs: Normal respirations with no wheezes, no rhonchi, no rales , no crackles   Heart: Regular rate and rhythm, normal S1 S2, no murmur, no rub, no gallop, PMI is normal size and placement, carotid upstroke normal without bruit, jugular venous pressure normal Abdomen: Soft, non-tender, non-distended with normoactive bowel sounds. No hepatosplenomegaly. Abdominal aorta is normal size without bruit Extremities: No edema, no clubbing, no cyanosis, no ulcers,  Peripheral: 2+ radial, 2+ femoral, 2+ dorsal pedal pulses Neuro: Alert and oriented. Moves all extremities spontaneously. Psych:  Responds to questions appropriately with a normal affect.   Intake/Output Summary (Last 24 hours) at 11/05/2020  1225 Last data filed at 11/05/2020 1001 Gross per 24 hour  Intake 240 ml  Output 2950 ml  Net -2710 ml    Inpatient Medications:   benazepril  40 mg Oral Daily   enoxaparin (LOVENOX) injection  30 mg Subcutaneous Q24H   furosemide  40 mg Intravenous Q12H   hydrALAZINE  100 mg Oral Q8H   insulin aspart  0-15 Units Subcutaneous TID WC   metoprolol succinate  100 mg Oral Daily   potassium chloride SA  20 mEq Oral Daily   sodium chloride flush  3 mL Intravenous Q12H   Infusions:   sodium chloride      Labs: Recent Labs    11/04/20 0441 11/05/20 0628  NA 143 142  K 3.6 3.4*  CL 109 107  CO2 25 24  GLUCOSE 167* 111*  BUN 48* 57*  CREATININE 2.06* 2.22*  CALCIUM 8.9 9.3   No results for input(s): AST, ALT, ALKPHOS, BILITOT, PROT, ALBUMIN in the last 72 hours. Recent Labs    11/03/20 0514 11/04/20 1256 11/05/20 0628  WBC 16.6* 17.5* 13.4*  NEUTROABS 13.0*  --   --   HGB 11.2* 9.8* 10.1*  HCT 33.8* 29.5* 30.0*  MCV 88.7 87.8 87.5  PLT 264 243 261   No results for input(s): CKTOTAL, CKMB, TROPONINI in the last 72 hours. Invalid input(s): POCBNP Recent Labs    11/03/20 1144  HGBA1C 6.0*     Weights: Filed Weights   11/03/20 0505 11/05/20 0500 11/05/20 0506  Weight: 78.9 kg 80.4 kg 79.3 kg     Radiology/Studies:  CT CHEST WO CONTRAST  Result Date: 11/03/2020 CLINICAL DATA:  Shortness of breath.  Pleural effusions. EXAM: CT CHEST WITHOUT CONTRAST TECHNIQUE: Multidetector CT imaging of the chest was performed following the standard protocol without IV contrast. COMPARISON:  None. FINDINGS: Cardiovascular: No acute findings. Aortic atherosclerotic calcification noted. Mediastinum/Nodes: Diffusely enlarged and heterogeneous appearance of the thyroid gland is demonstrated. This likely represents a multinodular goiter, although thyroid nodules are difficult to evaluate on this unenhanced exam. No other masses or pathologically enlarged lymph nodes identified  on this unenhanced exam. Lungs/Pleura: Small bilateral pleural effusions are seen with associated compressive atelectasis in both lung bases. Asymmetric diffuse ground-glass opacity is seen involving the right lung greater than left. This may be due to asymmetric edema or atypical infectious or inflammatory process. No suspicious pulmonary nodules or masses are identified. No evidence of central endobronchial obstruction. Upper Abdomen: Moderate hiatal hernia is seen. A complex lesion is seen in the lateral upper pole of the right kidney which measures 3.3 cm, but cannot be characterized on this unenhanced exam. A probable small left adrenal mass is seen measuring 1.3 cm, also nonspecific. Musculoskeletal:  No suspicious bone lesions. IMPRESSION: Asymmetric diffuse ground-glass opacity in right lung greater than left, which may be due to asymmetric edema, or atypical infectious or inflammatory process. No evidence of pulmonary mass or central endobronchial obstruction. Small bilateral pleural effusions with bibasilar atelectasis. Moderate hiatal hernia. Diffusely enlarged and heterogeneous appearance of thyroid gland. Recommend thyroid ultrasound. (Ref: J Am Coll Radiol. 2015 Feb;12(2): 143-50). Indeterminate right renal lesion and probable small left adrenal mass. Abdomen MRI without and with contrast is recommended for further characterization. Aortic Atherosclerosis (ICD10-I70.0). Electronically Signed   By: Marlaine Hind M.D.   On: 11/03/2020 09:32   MR ABDOMEN WO CONTRAST  Result Date: 11/04/2020 CLINICAL DATA:  60 year old female with history of shortness of breath. Complex lesion in the upper pole of the right kidney and left adrenal lesion noted on recent chest CT. Follow-up study. EXAM: MRI ABDOMEN WITHOUT CONTRAST TECHNIQUE: Multiplanar multisequence MR imaging was performed without the administration of intravenous contrast. COMPARISON:  No prior abdominal MRI. Chest CT 11/03/2020. CT the abdomen and  pelvis 08/18/2020. FINDINGS: Comment: Today's study is limited for detection and characterization of visceral and/or vascular lesions by lack of IV gadolinium. In addition, several of the pulse sequences are limited by patient respiratory motion. Lower chest: Cardiomegaly. Small amount of T2 signal intensity lying dependently in the thorax bilaterally, indicative of small bilateral pleural effusions. Large hiatal hernia. Hepatobiliary: No discrete cystic or solid hepatic lesions are confidently identified on today's noncontrast examination. No intra or extrahepatic biliary ductal dilatation. Gallbladder is unremarkable in appearance. Pancreas: No definite pancreatic mass or peripancreatic fluid collections or inflammatory changes noted on today's noncontrast examination. No pancreatic ductal dilatation. Spleen:  Unremarkable. Adrenals/Urinary Tract: Unfortunately, assessment of renal lesions is severely compromised by patient respiratory motion, and lack of IV gadolinium. With these limitations in mind, there are multiple T1 hypointense and T2 hyperintense lesions which appear to represent cysts, largest of which is in the anterior aspect of the interpolar region of the left kidney measuring 2.0 cm in diameter. The lesion of concern in the upper pole of the right kidney appears to be simple measuring 1.9 cm in diameter. No hydroureteronephrosis in the visualized portions of the abdomen. Mild bilateral adrenal form thickening without discrete adrenal mass. Stomach/Bowel: Visualized portions are unremarkable. Vascular/Lymphatic: No aneurysm identified in the visualized abdominal vasculature. No lymphadenopathy noted in the abdomen. Other: No significant volume of ascites in the visualized portions of the peritoneal cavity. Musculoskeletal: No aggressive appearing osseous lesions are noted in the visualized portions of the skeleton. IMPRESSION: 1. Limited examination demonstrating what appear to be  multiple simple  cysts in the kidneys bilaterally. These are technically incompletely characterized on today's noncontrast study. Further evaluation with abdominal MRI with and without IV gadolinium could be obtained to provide definitive characterization in the future if clinically appropriate. 2. No adrenal mass identified. 3. Cardiomegaly. 4. Small bilateral pleural effusions. 5. Large hiatal hernia. Electronically Signed   By: Vinnie Langton M.D.   On: 11/04/2020 08:00   DG Chest Portable 1 View  Result Date: 11/03/2020 CLINICAL DATA:  Shortness of breath EXAM: PORTABLE CHEST 1 VIEW COMPARISON:  None available FINDINGS: Cardiomegaly. Symmetric interstitial opacity with cephalized blood flow and probable small pleural effusions. There is streaky density at both lung bases. Negative aortic and hilar contours IMPRESSION: Cardiomegaly and pulmonary opacification, favor CHF over infection. Electronically Signed   By: Monte Fantasia M.D.   On: 11/03/2020 06:00     Assessment and Recommendation  60 y.o. female with acute systolic dysfunction congestive heart failure with ejection fraction of 35% hypertension hyperlipidemia without evidence of myocardial infarction improved 1.  Continue furosemide orally at 40 mg upon discharge 2.  Continuation of hydralazine metoprolol and benazepril combination for cardiomyopathy and heart dysfunction as well as hypertension control 3.  No further cardiac diagnostics necessary at this time 4.  If ambulating well patient is okay for discharged home from cardiac standpoint with follow-up in 1 to 2 weeks  Signed, Serafina Royals M.D. FACC

## 2020-11-05 NOTE — Progress Notes (Deleted)
PROGRESS NOTE    Caroline Walls  [id number] DOB: [birth year] DOA: 11/03/2020 PCP: Gladstone Lighter, MD    Brief Narrative:  Caroline Walls is a 60 y.o. 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  Patient has had exertional shortness of breath for about 3 days that worsened the night prior to her presentation.  She has a cough which is occasionally productive of yellow phlegm as well as bilateral lower extremity swelling.  She was seen by her primary care provider who was concerned that she may have CHF and is being worked up for same. Per EMS patient had room air pulse oximetry of 48% and required oxygen supplementation with a nonrebreather mask to maintain pulse oximetry greater than 92%.  She received DuoNeb and Solu-Medrol 125 mg prior to presenting to the ER.  1/20-less sob. Off o2, on RA 1/21-sob improving. No cp. Ambulating without any difficulty  Consultants:   cardiology  Procedures:   Antimicrobials:       Subjective: Less sob, but not at baseline  Objective: Vitals:   11/04/20 2213 11/05/20 0500 11/05/20 0506 11/05/20 0539  BP: (!) 144/82  (!) 161/74 (!) 166/71  Pulse: 60  75 72  Resp: '18  20 17  '$ Temp:   98 F (36.7 C) 97.7 F (36.5 C)  TempSrc:   Oral Oral  SpO2: 98%  98% 94%  Weight:  80.4 kg 79.3 kg   Height:        Intake/Output Summary (Last 24 hours) at 11/05/2020 0830 Last data filed at 11/05/2020 [id number] Gross per 24 hour  Intake 243 ml  Output 2600 ml  Net -2357 ml   Filed Weights   11/03/20 0505 11/05/20 0500 11/05/20 0506  Weight: 78.9 kg 80.4 kg 79.3 kg    Examination: Calm, nad Minimal scattered rales b/l Regular s1/s2 no gallop Soft benign, +bs +edema, improving Aaxoxo3, grossly intact   Data Reviewed: I have personally reviewed following labs and  imaging studies  CBC: Recent Labs  Lab 11/03/20 0514 11/04/20 1256 11/05/20 0628  WBC 16.6* 17.5* 13.4*  NEUTROABS 13.0*  --   --   HGB 11.2* 9.8* 10.1*  HCT 33.8* 29.5* 30.0*  MCV 88.7 87.8 87.5  PLT 264 243 0000000   Basic Metabolic Panel: Recent Labs  Lab 11/03/20 0514 11/04/20 0441 11/05/20 0628  NA 143 143 142  K 3.7 3.6 3.4*  CL 104 109 107  CO2 '22 25 24  '$ GLUCOSE 228* 167* 111*  BUN 33* 48* 57*  CREATININE 2.05* 2.06* 2.22*  CALCIUM 8.8* 8.9 9.3   GFR: Estimated Creatinine Clearance: 26 mL/min (A) (by C-G formula based on SCr of 2.22 mg/dL (H)). Liver Function Tests: No results for input(s): AST, ALT, ALKPHOS, BILITOT, PROT, ALBUMIN in the last 168 hours. No results for input(s): LIPASE, AMYLASE in the last 168 hours. No results for input(s): AMMONIA in the last 168 hours. Coagulation Profile: No results for input(s): INR, PROTIME in the last 168 hours. Cardiac Enzymes: No results for input(s): CKTOTAL, CKMB, CKMBINDEX, TROPONINI in the last 168 hours. BNP (last 3 results) No results for input(s): PROBNP in the last 8760 hours. HbA1C: Recent Labs    11/03/20 1144  HGBA1C 6.0*   CBG: Recent Labs  Lab 11/03/20 1736 11/04/20 0736 11/04/20 1203 11/04/20 1709 11/04/20 2227  GLUCAP 202* 232* 147* 186*  114*   Lipid Profile: No results for input(s): CHOL, HDL, LDLCALC, TRIG, CHOLHDL, LDLDIRECT in the last 72 hours. Thyroid Function Tests: No results for input(s): TSH, T4TOTAL, FREET4, T3FREE, THYROIDAB in the last 72 hours. Anemia Panel: No results for input(s): VITAMINB12, FOLATE, FERRITIN, TIBC, IRON, RETICCTPCT in the last 72 hours. Sepsis Labs: Recent Labs  Lab 11/03/20 0514 11/03/20 0839  PROCALCITON <0.10  --   LATICACIDVEN  --  1.4    Recent Results (from the past 240 hour(s))  Resp Panel by RT-PCR (Flu A&B, Covid) Nasopharyngeal Swab     Status: None   Collection Time: 11/03/20  5:54 AM   Specimen: Nasopharyngeal Swab; Nasopharyngeal(NP)  swabs in vial transport medium  Result Value Ref Range Status   SARS Coronavirus 2 by RT PCR NEGATIVE NEGATIVE Final    Comment: (NOTE) SARS-CoV-2 target nucleic acids are NOT DETECTED.  The SARS-CoV-2 RNA is generally detectable in upper respiratory specimens during the acute phase of infection. The lowest concentration of SARS-CoV-2 viral copies this assay can detect is 138 copies/mL. A negative result does not preclude SARS-Cov-2 infection and should not be used as the sole basis for treatment or other patient management decisions. A negative result may occur with  improper specimen collection/handling, submission of specimen other than nasopharyngeal swab, presence of viral mutation(s) within the areas targeted by this assay, and inadequate number of viral copies(<138 copies/mL). A negative result must be combined with clinical observations, patient history, and epidemiological information. The expected result is Negative.  Fact Sheet for Patients:  EntrepreneurPulse.com.au  Fact Sheet for Healthcare Providers:  IncredibleEmployment.be  This test is no t yet approved or cleared by the [place] FDA and  has been authorized for detection and/or diagnosis of SARS-CoV-2 by FDA under an Emergency Use Authorization (EUA). This EUA will remain  in effect (meaning this test can be used) for the duration of the COVID-19 declaration under Section 564(b)(1) of the Act, 21 U.S.C.section 360bbb-3(b)(1), unless the authorization is terminated  or revoked sooner.       Influenza A by PCR NEGATIVE NEGATIVE Final   Influenza B by PCR NEGATIVE NEGATIVE Final    Comment: (NOTE) The Xpert Xpress SARS-CoV-2/FLU/RSV plus assay is intended as an aid in the diagnosis of influenza from Nasopharyngeal swab specimens and should not be used as a sole basis for treatment. Nasal washings and aspirates are unacceptable for Xpert Xpress  SARS-CoV-2/FLU/RSV testing.  Fact Sheet for Patients: EntrepreneurPulse.com.au  Fact Sheet for Healthcare Providers: IncredibleEmployment.be  This test is not yet approved or cleared by the [place] FDA and has been authorized for detection and/or diagnosis of SARS-CoV-2 by FDA under an Emergency Use Authorization (EUA). This EUA will remain in effect (meaning this test can be used) for the duration of the COVID-19 declaration under Section 564(b)(1) of the Act, 21 U.S.C. section 360bbb-3(b)(1), unless the authorization is terminated or revoked.  Performed at [facility], [address] [place], [place] [address]   Culture, blood (Routine X 2) w Reflex to ID Panel     Status: None (Preliminary result)   Collection Time: 11/03/20  8:40 AM   Specimen: BLOOD  Result Value Ref Range Status   Specimen Description BLOOD RIGHT [facility]  Final   Special Requests   Final    BOTTLES DRAWN AEROBIC AND ANAEROBIC Blood Culture adequate volume   Culture   Final    NO GROWTH 2 DAYS Performed at [facility], [place],  [place] [address]    Report Status PENDING  Incomplete  Culture, blood (Routine X 2) w Reflex to ID Panel     Status: None (Preliminary result)   Collection Time: 11/03/20  8:44 AM   Specimen: BLOOD  Result Value Ref Range Status   Specimen Description BLOOD RIGHT HAND  Final   Special Requests   Final    BOTTLES DRAWN AEROBIC AND ANAEROBIC Blood Culture adequate volume   Culture   Final    NO GROWTH 2 DAYS Performed at [facility], [address] [place], [place] [address]    Report Status PENDING  Incomplete         Radiology Studies: CT CHEST WO CONTRAST  Result Date: 11/03/2020 CLINICAL DATA:  Shortness of breath.  Pleural effusions. EXAM: CT CHEST WITHOUT CONTRAST TECHNIQUE: Multidetector CT imaging of the chest was performed following the standard protocol without IV contrast.  COMPARISON:  None. FINDINGS: Cardiovascular: No acute findings. Aortic atherosclerotic calcification noted. Mediastinum/Nodes: Diffusely enlarged and heterogeneous appearance of the thyroid gland is demonstrated. This likely represents a multinodular goiter, although thyroid nodules are difficult to evaluate on this unenhanced exam. No other masses or pathologically enlarged lymph nodes identified on this unenhanced exam. Lungs/Pleura: Small bilateral pleural effusions are seen with associated compressive atelectasis in both lung bases. Asymmetric diffuse ground-glass opacity is seen involving the right lung greater than left. This may be due to asymmetric edema or atypical infectious or inflammatory process. No suspicious pulmonary nodules or masses are identified. No evidence of central endobronchial obstruction. Upper Abdomen: Moderate hiatal hernia is seen. A complex lesion is seen in the lateral upper pole of the right kidney which measures 3.3 cm, but cannot be characterized on this unenhanced exam. A probable small left adrenal mass is seen measuring 1.3 cm, also nonspecific. Musculoskeletal:  No suspicious bone lesions. IMPRESSION: Asymmetric diffuse ground-glass opacity in right lung greater than left, which may be due to asymmetric edema, or atypical infectious or inflammatory process. No evidence of pulmonary mass or central endobronchial obstruction. Small bilateral pleural effusions with bibasilar atelectasis. Moderate hiatal hernia. Diffusely enlarged and heterogeneous appearance of thyroid gland. Recommend thyroid ultrasound. (Ref: J Am Coll Radiol. 2015 Feb;12(2): 143-50). Indeterminate right renal lesion and probable small left adrenal mass. Abdomen MRI without and with contrast is recommended for further characterization. Aortic Atherosclerosis (ICD10-I70.0). Electronically Signed   By: Marlaine Hind M.D.   On: 11/03/2020 09:32   MR ABDOMEN WO CONTRAST  Result Date: 11/04/2020 CLINICAL DATA:   60 year old female with history of shortness of breath. Complex lesion in the upper pole of the right kidney and left adrenal lesion noted on recent chest CT. Follow-up study. EXAM: MRI ABDOMEN WITHOUT CONTRAST TECHNIQUE: Multiplanar multisequence MR imaging was performed without the administration of intravenous contrast. COMPARISON:  No prior abdominal MRI. Chest CT 11/03/2020. CT the abdomen and pelvis 08/18/2020. FINDINGS: Comment: Today's study is limited for detection and characterization of visceral and/or vascular lesions by lack of IV gadolinium. In addition, several of the pulse sequences are limited by patient respiratory motion. Lower chest: Cardiomegaly. Small amount of T2 signal intensity lying dependently in the thorax bilaterally, indicative of small bilateral pleural effusions. Large hiatal hernia. Hepatobiliary: No discrete cystic or solid hepatic lesions are confidently identified on today's noncontrast examination. No intra or extrahepatic biliary ductal dilatation. Gallbladder is unremarkable in appearance. Pancreas: No definite pancreatic mass or peripancreatic fluid collections or inflammatory changes noted on today's noncontrast examination. No pancreatic ductal dilatation. Spleen:  Unremarkable. Adrenals/Urinary Tract: Unfortunately, assessment of renal lesions is severely compromised by patient respiratory motion, and lack of IV gadolinium. With these limitations in mind, there are multiple T1 hypointense and T2 hyperintense lesions which appear to represent cysts, largest of which is in the anterior aspect of the interpolar region of the left kidney measuring 2.0 cm in diameter. The lesion of concern in the upper pole of the right kidney appears to be simple measuring 1.9 cm in diameter. No hydroureteronephrosis in the visualized portions of the abdomen. Mild bilateral adrenal form thickening without discrete adrenal mass. Stomach/Bowel: Visualized portions are unremarkable.  Vascular/Lymphatic: No aneurysm identified in the visualized abdominal vasculature. No lymphadenopathy noted in the abdomen. Other: No significant volume of ascites in the visualized portions of the peritoneal cavity. Musculoskeletal: No aggressive appearing osseous lesions are noted in the visualized portions of the skeleton. IMPRESSION: 1. Limited examination demonstrating what appear to be multiple simple cysts in the kidneys bilaterally. These are technically incompletely characterized on today's noncontrast study. Further evaluation with abdominal MRI with and without IV gadolinium could be obtained to provide definitive characterization in the future if clinically appropriate. 2. No adrenal mass identified. 3. Cardiomegaly. 4. Small bilateral pleural effusions. 5. Large hiatal hernia. Electronically Signed   By: Vinnie Langton M.D.   On: 11/04/2020 08:00        Scheduled Meds: . benazepril  40 mg Oral Daily  . enoxaparin (LOVENOX) injection  30 mg Subcutaneous Q24H  . furosemide  40 mg Intravenous Q12H  . hydrALAZINE  100 mg Oral Q8H  . insulin aspart  0-15 Units Subcutaneous TID WC  . LORazepam  1 mg Oral Once   Or  . LORazepam  1 mg Intramuscular Once  . metoprolol succinate  100 mg Oral Daily  . potassium chloride SA  20 mEq Oral Daily  . sodium chloride flush  3 mL Intravenous Q12H   Continuous Infusions: . sodium chloride      Assessment & Plan:   Principal Problem:   Acute on chronic systolic CHF (congestive heart failure) (HCC) Active Problems:   Acute respiratory failure (HCC)   Type 2 diabetes mellitus with stage 4 chronic kidney disease (HCC)   Essential hypertension   Acute on chronic systolic heart failure Patient was brought into the ER via EMS for evaluation of worsening shortness of breath associated with bilateral lower extremity swelling She had a 2D echocardiogram done on 01/17 as an out patient which showed an LVEF of 35% 1/21-improving, still mildly  volume overloaded. BNP still elevated Will decrease lasix to qd from bid Continue metoprolol, ACE inhibitor, hydralazine We will add Imdur Recommend transition to Entresto if possible as outpatient Needs to follow-up cardiology as outpatient- in 1-2 weeks Per cards d/c on lasix '40mg'$  qd     Elevated troponin-Most likely secondary to demand ischemia from acute CHF Cp free ekg without acute ischemic changes Continue tele. Cards following   Acute respiratory failure Most likely secondary to acute CHF Patient was noted to be hypoxic in the field with a room air pulse oximetry in the 40s associated with increased work of breathing and use of accessory muscles She initially required a nonrebreather mask but is currently on 6 L of oxygen via nasal cannula Continue oxygen supplementation to maintain pulse oximetry greater than 92% 1/21-on RA , improved.     Diabetes mellitus with complications of stage IV chronic kidney disease referal to nephrology as outpatient  BG controlled Carb control diet RISS  Hold glipizide during hospitalization   CKD-stage IIIb Stable with diuresis Continue to monitor Will need follow-up nephrology as outpatient    Hypertension Needs improvement  Add Imdur to current regimen    Right renal lesion/adrenal mass MRI of abdomen Limited exam but possibly with multiple cysts simple cyst.  They did recommend abdominal MRI with and without IV gadolinium, but due to her renal function I did have a discussion with patient about avoiding gadolinium for now.  She verbalizes an understanding.  She does understand she needs to follow-up with nephrology as outpatient.  She reports there was plan for her to follow-up with nephrology as outpatient          DVT prophylaxis: lovenox Code Status:full Family Communication: none at bedside  Status is: Inpatient  Remains inpatient appropriate because:IV treatments appropriate due to  intensity of illness or inability to take PO   Dispo: The patient is from: Home              Anticipated d/c is to: Home              Anticipated d/c date is: 1 day              Patient currently is not medically stable to d/c.still needs iv diuresis            LOS: 2 days   Time spent: 45 min with >50% on coc    Nolberto Hanlon, MD [facility] Pager [phone]  If 7PM-7AM, please contact night-coverage 11/05/2020, 8:30 AM

## 2020-11-05 NOTE — Progress Notes (Addendum)
° °  Heart Failure Nurse Navigator Note  HFrEF 35%.  Mild right ventricular systolic function.  She presented to the emergency room with complaints of worsening shortness of breath, PND, orthopnea and lower extremity edema.  Comorbidities:  Hypertension Hyperlipidemia Type 2 diabetes Chronic kidney disease stage III   Medications:  Benazepril 40 mg daily Furosemide 40 mg IV every 12 hours Hydralazine 100 mg every 8 hours Metoprolol succinate 100 mg daily Potassium chloride 20 mEq daily  Question if she would not benefit from addition of SGLT2I to her medication regime.  Per cardiology's note they plan to add Entresto as an outpatient  Or considering adding Imdur to the hydralazine.  Labs:  Sodium 142, potassium 3.4, chloride 107, CO2 24, BUN 57 was 48 yesterday, creatinine 2.22 was 2.06 yesterday Intake 243 mL Output 3050 mL BMI is 31.97 Blood pressure 166/71   Assessment:  General-she is awake and alert sitting up on the edge of the bed currently on room air, in no acute distress.   HEENT-pupils are equal, normocephalic.  No JVD noted.  Cardiac-heart tones of regular rate and rhythm no murmurs or rubs appreciated.  Chest-breath sounds are clear to posterior auscultation.  Musculoskeletal-there is 1+ pitting edema bilaterally to lower extremities  Psych-is pleasant and appropriate.  Neurologic- speech is  clear she moves all extremities without difficulty.    Spoke with patient today, she states that she feels somewhat better, is not short of breath, currently on room air.  Discussed low-sodium diet, she voices interest in learning about Mediterranean diet.  Supplied information.   Pricilla Riffle RN [facility]

## 2020-11-05 NOTE — Plan of Care (Signed)
Pt Axox4. Calm and cooperative. Pt remains on RA, but some shortness of breath noted when ambulating. Pt reports swelling in her legs has gone down. Safety measures in place. Will continue to monitor. Problem: Education: Goal: Knowledge of General Education information will improve Description: Including pain rating scale, medication(s)/side effects and non-pharmacologic comfort measures Outcome: Progressing   Problem: Health Behavior/Discharge Planning: Goal: Ability to manage health-related needs will improve Outcome: Progressing   Problem: Clinical Measurements: Goal: Ability to maintain clinical measurements within normal limits will improve Outcome: Progressing Goal: Will remain free from infection Outcome: Progressing Goal: Diagnostic test results will improve Outcome: Progressing Goal: Respiratory complications will improve Outcome: Progressing Goal: Cardiovascular complication will be avoided Outcome: Progressing   Problem: Activity: Goal: Risk for activity intolerance will decrease Outcome: Progressing   Problem: Nutrition: Goal: Adequate nutrition will be maintained Outcome: Progressing   Problem: Coping: Goal: Level of anxiety will decrease Outcome: Progressing   Problem: Elimination: Goal: Will not experience complications related to bowel motility Outcome: Progressing Goal: Will not experience complications related to urinary retention Outcome: Progressing   Problem: Pain Managment: Goal: General experience of comfort will improve Outcome: Progressing   Problem: Safety: Goal: Ability to remain free from injury will improve Outcome: Progressing   Problem: Skin Integrity: Goal: Risk for impaired skin integrity will decrease Outcome: Progressing

## 2020-11-05 NOTE — Discharge Summary (Signed)
Caroline Walls [id number] DOB: July 09, 1961 DOA: 11/03/2020  PCP: Gladstone Lighter, MD  Admit date: 11/03/2020 Discharge date: 11/05/2020  Admitted From: Home Disposition: Home  Recommendations for Outpatient Follow-up:  1. Follow up with PCP in 1 week 2. Please obtain BMP/CBC in one week 3. Follow-up with cardiology in 1 week 4. Follow-up with nephrology in 1 week  Home Health: Yes   Discharge Condition:Stable CODE STATUS: Full Diet recommendation: Carb control low-sodium diet   Brief/Interim Summary: Caroline Walls is a 60 y.o. 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  He was found with acute on chronic systolic heart failure.  Her BNP on admission was elevated chest x-ray was consistent with CHF.  She had a CT scan of the chest without contrast revealing results stated below.  There was a indeterminate right renal lesion and an abdominal MRI was recommended.   Cardiology was consulted.  Initially she was placed on nonrebreather mask tapered on room air.  With ambulation without oxygen her O2 sat was above 92%.  Acute on chronic systolic heart failure Patient was brought into the ER via EMS for evaluation of worsening shortness of breath associated with bilateral lower extremity swelling She had a 2D echocardiogramdone on 01/17 as an out patientwhich showed an LVEF of 35% More euvolemic and ambulating on room air with O2 sats above 92% She reports she is at her baseline now.  Cardiology was following. Will discharge on Lasix, metoprolol, ACE inhibitor, hydralazine and added Imdur today. Will need to follow-up as outpatient for transition to [facility]. She was cleared by cardiology to be discharged today.     Elevated troponin-Most likely secondary to demand ischemia from acute CHF Cp  free ekg without acute ischemic changes    Acute respiratory failure-Most likely secondary to acute CHF Improved and on room air satting above 92%   Diabetes mellitus with complications of stage IV chronic kidney disease Follow-up with her nephrologist as outpatient in 1 week BG controlled Carb control diet Continue outpatient meds   CKD-stage IIIb Stable with diuresis Will need follow-up nephrology as outpatient    Hypertension Added Imdur, continue hydralazine, ACE inhibitor, metoprolol Further management by cardiology or PCP as outpatient   Right renal lesion/adrenal mass MRI of abdomen Limited exam but possibly with multiple cysts simple cyst.  They did recommend abdominal MRI with and without IV gadolinium, but due to her renal function I did have a discussion with patient about avoiding gadolinium for now.  She verbalizes an understanding.  She does understand she needs to follow-up with nephrology as outpatient.  She reports there was plan for her to follow-up with nephrology as outpatient     Discharge Diagnoses:  Principal Problem:   Acute on chronic systolic CHF (congestive heart failure) ([place]) Active Problems:   Acute respiratory failure ([facility])   Type 2 diabetes mellitus with stage 4 chronic kidney disease ([place])   Essential hypertension    Discharge Instructions  Discharge Instructions    Call MD for:  difficulty breathing, headache or visual disturbances   Complete by: As directed    Diet - low sodium heart healthy   Complete by: As directed    Diet Carb Modified   Complete by: As directed    Discharge instructions   Complete by: As directed    Follow low sodium diet Follow  up with nephrology   Increase activity slowly   Complete by: As directed      Allergies as of 11/05/2020      Reactions   Penicillins Other (See Comments)   Patient get's a yeast infection every time she takes medication. Other reaction(s): Other (See  Comments) Patient get's a yeast infection every time she takes medication.      Medication List    STOP taking these medications   amLODipine-benazepril 10-40 MG capsule Commonly known as: LOTREL   meloxicam 15 MG tablet Commonly known as: MOBIC     TAKE these medications   benazepril 40 MG tablet Commonly known as: LOTENSIN Take 1 tablet (40 mg total) by mouth daily. Start taking on: November 06, 2020   furosemide 40 MG tablet Commonly known as: LASIX Take 40 mg by mouth daily.   glipiZIDE 2.5 MG 24 hr tablet Commonly known as: GLUCOTROL XL Take 2.5 mg by mouth daily.   hydrALAZINE 100 MG tablet Commonly known as: APRESOLINE Take 1 tablet (100 mg total) by mouth every 8 (eight) hours. What changed: when to take this   isosorbide mononitrate 30 MG 24 hr tablet Commonly known as: IMDUR Take 1 tablet (30 mg total) by mouth daily.   metoprolol succinate 100 MG 24 hr tablet Commonly known as: TOPROL-XL Take 100 mg by mouth daily.   potassium chloride SA 20 MEQ tablet Commonly known as: KLOR-CON Take 1 tablet (20 mEq total) by mouth daily. Start taking on: November 06, 2020       Follow-up Information    [facility] Follow up on 11/17/2020.   Specialty: Cardiology Why: at 12:00pm. Enter through the [place] entrance Contact information: [place] [phone]       Isaias Cowman, MD Follow up in 1 week(s).   Specialty: Cardiology Contact information: [facility] [place] [address] [phone]        Gladstone Lighter, MD Follow up in 1 week(s).   Specialty: Internal Medicine Contact information: [place] [address] [phone]        Sherryll Burger, MD Follow up in 1 week(s).   Specialty: Nephrology Contact information: [place]  [id number] [phone]              Allergies  Allergen Reactions  . Penicillins Other (See Comments)    Patient get's a yeast infection every time she takes medication. Other reaction(s): Other (See Comments) Patient get's a yeast infection every time she takes medication.     Consultations:  Cardiology   Procedures/Studies: CT CHEST WO CONTRAST  Result Date: 11/03/2020 CLINICAL DATA:  Shortness of breath.  Pleural effusions. EXAM: CT CHEST WITHOUT CONTRAST TECHNIQUE: Multidetector CT imaging of the chest was performed following the standard protocol without IV contrast. COMPARISON:  None. FINDINGS: Cardiovascular: No acute findings. Aortic atherosclerotic calcification noted. Mediastinum/Nodes: Diffusely enlarged and heterogeneous appearance of the thyroid gland is demonstrated. This likely represents a multinodular goiter, although thyroid nodules are difficult to evaluate on this unenhanced exam. No other masses or pathologically enlarged lymph nodes identified on this unenhanced exam. Lungs/Pleura: Small bilateral pleural effusions are seen with associated compressive atelectasis in both lung bases. Asymmetric diffuse ground-glass opacity is seen involving the right lung greater than left. This may be due to asymmetric edema or atypical infectious or inflammatory process. No suspicious pulmonary nodules  or masses are identified. No evidence of central endobronchial obstruction. Upper Abdomen: Moderate hiatal hernia is seen. A complex lesion is seen in the lateral upper pole of the right kidney which measures 3.3 cm, but cannot be characterized on this unenhanced exam. A probable small left adrenal mass is seen measuring 1.3 cm, also nonspecific. Musculoskeletal:  No suspicious bone lesions. IMPRESSION: Asymmetric diffuse ground-glass opacity in right lung greater than left, which may be due to asymmetric edema, or atypical infectious or inflammatory process. No evidence of pulmonary  mass or central endobronchial obstruction. Small bilateral pleural effusions with bibasilar atelectasis. Moderate hiatal hernia. Diffusely enlarged and heterogeneous appearance of thyroid gland. Recommend thyroid ultrasound. (Ref: J Am Coll Radiol. 2015 Feb;12(2): 143-50). Indeterminate right renal lesion and probable small left adrenal mass. Abdomen MRI without and with contrast is recommended for further characterization. Aortic Atherosclerosis (ICD10-I70.0). Electronically Signed   By: Marlaine Hind M.D.   On: 11/03/2020 09:32   MR ABDOMEN WO CONTRAST  Result Date: 11/04/2020 CLINICAL DATA:  60 year old female with history of shortness of breath. Complex lesion in the upper pole of the right kidney and left adrenal lesion noted on recent chest CT. Follow-up study. EXAM: MRI ABDOMEN WITHOUT CONTRAST TECHNIQUE: Multiplanar multisequence MR imaging was performed without the administration of intravenous contrast. COMPARISON:  No prior abdominal MRI. Chest CT 11/03/2020. CT the abdomen and pelvis 08/18/2020. FINDINGS: Comment: Today's study is limited for detection and characterization of visceral and/or vascular lesions by lack of IV gadolinium. In addition, several of the pulse sequences are limited by patient respiratory motion. Lower chest: Cardiomegaly. Small amount of T2 signal intensity lying dependently in the thorax bilaterally, indicative of small bilateral pleural effusions. Large hiatal hernia. Hepatobiliary: No discrete cystic or solid hepatic lesions are confidently identified on today's noncontrast examination. No intra or extrahepatic biliary ductal dilatation. Gallbladder is unremarkable in appearance. Pancreas: No definite pancreatic mass or peripancreatic fluid collections or inflammatory changes noted on today's noncontrast examination. No pancreatic ductal dilatation. Spleen:  Unremarkable. Adrenals/Urinary Tract: Unfortunately, assessment of renal lesions is severely compromised by patient  respiratory motion, and lack of IV gadolinium. With these limitations in mind, there are multiple T1 hypointense and T2 hyperintense lesions which appear to represent cysts, largest of which is in the anterior aspect of the interpolar region of the left kidney measuring 2.0 cm in diameter. The lesion of concern in the upper pole of the right kidney appears to be simple measuring 1.9 cm in diameter. No hydroureteronephrosis in the visualized portions of the abdomen. Mild bilateral adrenal form thickening without discrete adrenal mass. Stomach/Bowel: Visualized portions are unremarkable. Vascular/Lymphatic: No aneurysm identified in the visualized abdominal vasculature. No lymphadenopathy noted in the abdomen. Other: No significant volume of ascites in the visualized portions of the peritoneal cavity. Musculoskeletal: No aggressive appearing osseous lesions are noted in the visualized portions of the skeleton. IMPRESSION: 1. Limited examination demonstrating what appear to be multiple simple cysts in the kidneys bilaterally. These are technically incompletely characterized on today's noncontrast study. Further evaluation with abdominal MRI with and without IV gadolinium could be obtained to provide definitive characterization in the future if clinically appropriate. 2. No adrenal mass identified. 3. Cardiomegaly. 4. Small bilateral pleural effusions. 5. Large hiatal hernia. Electronically Signed   By: Vinnie Langton M.D.   On: 11/04/2020 08:00   DG Chest Portable 1 View  Result Date: 11/03/2020 CLINICAL DATA:  Shortness of breath EXAM: PORTABLE CHEST 1 VIEW COMPARISON:  None available FINDINGS: Cardiomegaly.  Symmetric interstitial opacity with cephalized blood flow and probable small pleural effusions. There is streaky density at both lung bases. Negative aortic and hilar contours IMPRESSION: Cardiomegaly and pulmonary opacification, favor CHF over infection. Electronically Signed   By: Monte Fantasia M.D.    On: 11/03/2020 06:00       Subjective: No sob, ambulating with doe. No cp  Discharge Exam: Vitals:   11/05/20 1303 11/05/20 1654  BP: (!) 156/80 (!) 159/78  Pulse: (!) 57 (!) 51  Resp: 16 16  Temp: 97.6 F (36.4 C) 97.6 F (36.4 C)  SpO2: 100% 100%   Vitals:   11/05/20 0539 11/05/20 0945 11/05/20 1303 11/05/20 1654  BP: (!) 166/71 (!) 167/83 (!) 156/80 (!) 159/78  Pulse: 72 71 (!) 57 (!) 51  Resp: '17 16 16 16  '$ Temp: 97.7 F (36.5 C) 98.5 F (36.9 C) 97.6 F (36.4 C) 97.6 F (36.4 C)  TempSrc: Oral Oral Oral Oral  SpO2: 94% 94% 100% 100%  Weight:      Height:        General: Pt is alert, awake, not in acute distress Cardiovascular: RRR, S1/S2 +, no rubs, no gallops Respiratory: CTA bilaterally, no wheezing, no rhonchi Abdominal: Soft, NT, ND, bowel sounds + Extremities: no edema, no cyanosis    The results of significant diagnostics from this hospitalization (including imaging, microbiology, ancillary and laboratory) are listed below for reference.     Microbiology: Recent Results (from the past 240 hour(s))  Resp Panel by RT-PCR (Flu A&B, Covid) Nasopharyngeal Swab     Status: None   Collection Time: 11/03/20  5:54 AM   Specimen: Nasopharyngeal Swab; Nasopharyngeal(NP) swabs in vial transport medium  Result Value Ref Range Status   SARS Coronavirus 2 by RT PCR NEGATIVE NEGATIVE Final    Comment: (NOTE) SARS-CoV-2 target nucleic acids are NOT DETECTED.  The SARS-CoV-2 RNA is generally detectable in upper respiratory specimens during the acute phase of infection. The lowest concentration of SARS-CoV-2 viral copies this assay can detect is 138 copies/mL. A negative result does not preclude SARS-Cov-2 infection and should not be used as the sole basis for treatment or other patient management decisions. A negative result may occur with  improper specimen collection/handling, submission of specimen other than nasopharyngeal swab, presence of viral  mutation(s) within the areas targeted by this assay, and inadequate number of viral copies(<138 copies/mL). A negative result must be combined with clinical observations, patient history, and epidemiological information. The expected result is Negative.  Fact Sheet for Patients:  EntrepreneurPulse.com.au  Fact Sheet for Healthcare Providers:  IncredibleEmployment.be  This test is no t yet approved or cleared by the [place] FDA and  has been authorized for detection and/or diagnosis of SARS-CoV-2 by FDA under an Emergency Use Authorization (EUA). This EUA will remain  in effect (meaning this test can be used) for the duration of the COVID-19 declaration under Section 564(b)(1) of the Act, 21 U.S.C.section 360bbb-3(b)(1), unless the authorization is terminated  or revoked sooner.       Influenza A by PCR NEGATIVE NEGATIVE Final   Influenza B by PCR NEGATIVE NEGATIVE Final    Comment: (NOTE) The Xpert Xpress SARS-CoV-2/FLU/RSV plus assay is intended as an aid in the diagnosis of influenza from Nasopharyngeal swab specimens and should not be used as a sole basis for treatment. Nasal washings and aspirates are unacceptable for Xpert Xpress SARS-CoV-2/FLU/RSV testing.  Fact Sheet for Patients: EntrepreneurPulse.com.au  Fact Sheet for Healthcare Providers: IncredibleEmployment.be  This  test is not yet approved or cleared by the [place] and has been authorized for detection and/or diagnosis of SARS-CoV-2 by FDA under an Emergency Use Authorization (EUA). This EUA will remain in effect (meaning this test can be used) for the duration of the COVID-19 declaration under Section 564(b)(1) of the Act, 21 U.S.C. section 360bbb-3(b)(1), unless the authorization is terminated or revoked.  Performed at [facility], [address] [place], [place] [address]   Culture, blood (Routine X 2) w  Reflex to ID Panel     Status: None (Preliminary result)   Collection Time: 11/03/20  8:40 AM   Specimen: BLOOD  Result Value Ref Range Status   Specimen Description BLOOD RIGHT [facility]  Final   Special Requests   Final    BOTTLES DRAWN AEROBIC AND ANAEROBIC Blood Culture adequate volume   Culture   Final    NO GROWTH 2 DAYS Performed at [facility], [address] [place], [place] [address]    Report Status PENDING  Incomplete  Culture, blood (Routine X 2) w Reflex to ID Panel     Status: None (Preliminary result)   Collection Time: 11/03/20  8:44 AM   Specimen: BLOOD  Result Value Ref Range Status   Specimen Description BLOOD RIGHT HAND  Final   Special Requests   Final    BOTTLES DRAWN AEROBIC AND ANAEROBIC Blood Culture adequate volume   Culture   Final    NO GROWTH 2 DAYS Performed at [facility], [address] [place], [place] [address]    Report Status PENDING  Incomplete     Labs: BNP (last 3 results) Recent Labs    11/03/20 0514 11/05/20 0628  BNP 919.9* [id number]*   Basic Metabolic Panel: Recent Labs  Lab 11/03/20 0514 11/04/20 0441 11/05/20 0628  NA 143 143 142  K 3.7 3.6 3.4*  CL 104 109 107  CO2 '22 25 24  '$ GLUCOSE 228* 167* 111*  BUN 33* 48* 57*  CREATININE 2.05* 2.06* 2.22*  CALCIUM 8.8* 8.9 9.3   Liver Function Tests: No results for input(s): AST, ALT, ALKPHOS, BILITOT, PROT, ALBUMIN in the last 168 hours. No results for input(s): LIPASE, AMYLASE in the last 168 hours. No results for input(s): AMMONIA in the last 168 hours. CBC: Recent Labs  Lab 11/03/20 0514 11/04/20 1256 11/05/20 0628  WBC 16.6* 17.5* 13.4*  NEUTROABS 13.0*  --   --   HGB 11.2* 9.8* 10.1*  HCT 33.8* 29.5* 30.0*  MCV 88.7 87.8 87.5  PLT 264 243 261   Cardiac Enzymes: No results for input(s): CKTOTAL, CKMB, CKMBINDEX, TROPONINI in the last 168 hours. BNP: Invalid input(s): POCBNP CBG: Recent Labs  Lab 11/04/20 1709 11/04/20 2227 11/05/20 0949  11/05/20 1306 11/05/20 1656  GLUCAP 186* 114* 101* 158* 160*   D-Dimer No results for input(s): DDIMER in the last 72 hours. Hgb A1c Recent Labs    11/03/20 1144  HGBA1C 6.0*   Lipid Profile No results for input(s): CHOL, HDL, LDLCALC, TRIG, CHOLHDL, LDLDIRECT in the last 72 hours. Thyroid function studies No results for input(s): TSH, T4TOTAL, T3FREE, THYROIDAB in the last 72 hours.  Invalid input(s): FREET3 Anemia work up No results for input(s): VITAMINB12, FOLATE, FERRITIN, TIBC, IRON, RETICCTPCT in the last 72 hours. Urinalysis    Component Value Date/Time   COLORURINE COLORLESS (A) 11/03/2020 0810   APPEARANCEUR CLEAR (A) 11/03/2020 0810   LABSPEC 1.006 11/03/2020 0810   PHURINE 5.0 11/03/2020 0810  GLUCOSEU 50 (A) 11/03/2020 0810   HGBUR NEGATIVE 11/03/2020 0810   BILIRUBINUR NEGATIVE 11/03/2020 0810   KETONESUR NEGATIVE 11/03/2020 0810   PROTEINUR 100 (A) 11/03/2020 0810   NITRITE NEGATIVE 11/03/2020 0810   LEUKOCYTESUR NEGATIVE 11/03/2020 0810   Sepsis Labs Invalid input(s): PROCALCITONIN,  WBC,  LACTICIDVEN Microbiology Recent Results (from the past 240 hour(s))  Resp Panel by RT-PCR (Flu A&B, Covid) Nasopharyngeal Swab     Status: None   Collection Time: 11/03/20  5:54 AM   Specimen: Nasopharyngeal Swab; Nasopharyngeal(NP) swabs in vial transport medium  Result Value Ref Range Status   SARS Coronavirus 2 by RT PCR NEGATIVE NEGATIVE Final    Comment: (NOTE) SARS-CoV-2 target nucleic acids are NOT DETECTED.  The SARS-CoV-2 RNA is generally detectable in upper respiratory specimens during the acute phase of infection. The lowest concentration of SARS-CoV-2 viral copies this assay can detect is 138 copies/mL. A negative result does not preclude SARS-Cov-2 infection and should not be used as the sole basis for treatment or other patient management decisions. A negative result may occur with  improper specimen collection/handling, submission of specimen  other than nasopharyngeal swab, presence of viral mutation(s) within the areas targeted by this assay, and inadequate number of viral copies(<138 copies/mL). A negative result must be combined with clinical observations, patient history, and epidemiological information. The expected result is Negative.  Fact Sheet for Patients:  EntrepreneurPulse.com.au  Fact Sheet for Healthcare Providers:  IncredibleEmployment.be  This test is no t yet approved or cleared by the [place] FDA and  has been authorized for detection and/or diagnosis of SARS-CoV-2 by FDA under an Emergency Use Authorization (EUA). This EUA will remain  in effect (meaning this test can be used) for the duration of the COVID-19 declaration under Section 564(b)(1) of the Act, 21 U.S.C.section 360bbb-3(b)(1), unless the authorization is terminated  or revoked sooner.       Influenza A by PCR NEGATIVE NEGATIVE Final   Influenza B by PCR NEGATIVE NEGATIVE Final    Comment: (NOTE) The Xpert Xpress SARS-CoV-2/FLU/RSV plus assay is intended as an aid in the diagnosis of influenza from Nasopharyngeal swab specimens and should not be used as a sole basis for treatment. Nasal washings and aspirates are unacceptable for Xpert Xpress SARS-CoV-2/FLU/RSV testing.  Fact Sheet for Patients: EntrepreneurPulse.com.au  Fact Sheet for Healthcare Providers: IncredibleEmployment.be  This test is not yet approved or cleared by the [place] FDA and has been authorized for detection and/or diagnosis of SARS-CoV-2 by FDA under an Emergency Use Authorization (EUA). This EUA will remain in effect (meaning this test can be used) for the duration of the COVID-19 declaration under Section 564(b)(1) of the Act, 21 U.S.C. section 360bbb-3(b)(1), unless the authorization is terminated or revoked.  Performed at [facility], [address]  [place], [place] [address]   Culture, blood (Routine X 2) w Reflex to ID Panel     Status: None (Preliminary result)   Collection Time: 11/03/20  8:40 AM   Specimen: BLOOD  Result Value Ref Range Status   Specimen Description BLOOD RIGHT [facility]  Final   Special Requests   Final    BOTTLES DRAWN AEROBIC AND ANAEROBIC Blood Culture adequate volume   Culture   Final    NO GROWTH 2 DAYS Performed at [facility], [address], [place], [place] [address]    Report Status PENDING  Incomplete  Culture, blood (Routine X 2) w Reflex to ID Panel  Status: None (Preliminary result)   Collection Time: 11/03/20  8:44 AM   Specimen: BLOOD  Result Value Ref Range Status   Specimen Description BLOOD RIGHT HAND  Final   Special Requests   Final    BOTTLES DRAWN AEROBIC AND ANAEROBIC Blood Culture adequate volume   Culture   Final    NO GROWTH 2 DAYS Performed at [facility], [address] [place], [place] [address]    Report Status PENDING  Incomplete     Time coordinating discharge: Over 30 minutes  SIGNED:   Nolberto Hanlon, MD  [facility] 11/05/2020, 5:09 PM Pager   If 7PM-7AM, please contact night-coverage [email or web address] Password [email or web address]

## 2020-11-08 LAB — CULTURE, BLOOD (ROUTINE X 2)
Culture: NO GROWTH
Culture: NO GROWTH
Special Requests: ADEQUATE
Special Requests: ADEQUATE

## 2020-11-16 NOTE — Progress Notes (Signed)
Patient ID: Caroline Walls, female    DOB: 05-30-1961, 60 y.o.   MRN: [id number]  HPI  Caroline Walls is a 60 y/o female with a history of DM, HTN, CKD, previous tobacco use and chronic heart failure.   Echo report from 09/01/20 reviewed and showed an EF of 35% along with mild MR.   Admitted 11/03/20 due to worsening shortness of breath. Cardiology consult obtained. Initially on nonbreather mask and then transitioned off to room air. Elevated troponin thought to be due to demand ischemia. Discharged after 2 days.   She presents today for her initial visit with a chief complaint of minimal shortness of breath upon moderate exertion. She describes this as having been present for several weeks. She has associated fatigue, chest tightness, difficulty sleeping, anxiety and depression along with this. She denies any abdominal distention, palpitations, pedal edema, chest pain, cough or dizziness.   She says that she's weighing twice daily and has been closely reading food labels for sodium content. She reviews work[employer] for her job and would like a letter saying that she could return to work without restrictions.   Admits that she's not very excited about taking all these medications and possibly making too many changes.   Past Medical History:  Diagnosis Date  . CHF (congestive heart failure) ([place])   . Chronic kidney disease   . Diabetes mellitus without complication ([place])   . Hypertension    History reviewed. No pertinent surgical history. Family History  Problem Relation Age of Onset  . Heart failure Brother   . Heart failure Other    Social History   Tobacco Use  . Smoking status: Former Research scientist (life sciences)  . Smokeless tobacco: Never Used  Substance Use Topics  . Alcohol use: Not Currently   Allergies  Allergen Reactions  . Penicillins Other (See Comments)    Patient get's a yeast infection every time she takes medication. Other reaction(s): Other (See Comments) Patient get's a  yeast infection every time she takes medication.    Prior to Admission medications   Medication Sig Start Date End Date Taking? Authorizing Provider  benazepril (LOTENSIN) 40 MG tablet Take 1 tablet (40 mg total) by mouth daily. 11/06/20 12/06/20 Yes Nolberto Hanlon, MD  furosemide (LASIX) 40 MG tablet Take 40 mg by mouth daily. 09/03/20  Yes [provider]  glipiZIDE (GLUCOTROL XL) 2.5 MG 24 hr tablet Take 2.5 mg by mouth daily. 10/03/20  Yes [provider]  hydrALAZINE (APRESOLINE) 100 MG tablet Take 1 tablet (100 mg total) by mouth every 8 (eight) hours. 11/05/20 12/05/20 Yes Nolberto Hanlon, MD  isosorbide mononitrate (IMDUR) 30 MG 24 hr tablet Take 1 tablet (30 mg total) by mouth daily. 11/05/20 12/05/20 Yes Nolberto Hanlon, MD  metoprolol succinate (TOPROL-XL) 100 MG 24 hr tablet Take 100 mg by mouth daily. 10/27/20  Yes [provider]  potassium chloride SA (KLOR-CON) 20 MEQ tablet Take 1 tablet (20 mEq total) by mouth daily. 11/06/20 12/06/20 Yes Nolberto Hanlon, MD    Review of Systems  Constitutional: Positive for fatigue (improving). Negative for appetite change.  HENT: Negative for congestion, postnasal drip and sore throat.   Eyes: Negative.   Respiratory: Positive for chest tightness (at times) and shortness of breath. Negative for cough.   Cardiovascular: Negative for chest pain, palpitations and leg swelling.  Gastrointestinal: Negative for abdominal distention and abdominal pain.  Endocrine: Negative.   Genitourinary: Negative.   Musculoskeletal: Negative for back pain and neck pain.  Skin: Negative.   Allergic/Immunologic: Negative.   Neurological: Negative for dizziness and light-headedness.  Hematological: Negative for adenopathy. Does not bruise/bleed easily.  Psychiatric/Behavioral: Positive for sleep disturbance (trouble falling asleep; sleeping on 1 pillow). Negative for dysphoric mood. The patient is nervous/anxious (at times).    Vitals:   11/17/20  1232 11/17/20 1309  BP: (!) 161/75 (!) 152/78  Pulse: (!) 56   Resp: 18   SpO2: 95%   Weight: 168 lb 8 oz (76.4 kg)   Height: '5\' 2"'$  (1.575 m)    Wt Readings from Last 3 Encounters:  11/17/20 168 lb 8 oz (76.4 kg)  11/05/20 174 lb 12.8 oz (79.3 kg)   Lab Results  Component Value Date   CREATININE 2.22 (H) 11/05/2020   CREATININE 2.06 (H) 11/04/2020   CREATININE 2.05 (H) 11/03/2020    Physical Exam Vitals and nursing note reviewed.  Constitutional:      Appearance: Normal appearance.  HENT:     Head: Normocephalic and atraumatic.  Cardiovascular:     Rate and Rhythm: Regular rhythm. Bradycardia present.  Pulmonary:     Effort: Pulmonary effort is normal. No respiratory distress.     Breath sounds: No wheezing or rales.  Abdominal:     General: There is no distension.     Palpations: Abdomen is soft.  Musculoskeletal:        General: No tenderness.     Cervical back: Normal range of motion and neck supple.     Right lower leg: No edema.     Left lower leg: No edema.  Skin:    General: Skin is warm and dry.  Neurological:     General: No focal deficit present.     Mental Status: She is alert and oriented to person, place, and time.  Psychiatric:        Mood and Affect: Mood normal.        Behavior: Behavior normal.        Thought Content: Thought content normal.    Assessment & Plan:   1: Chronic heart failure with reduced ejection fraction- - NYHA class II - euvolemic today - weighing twice daily; advised her that she needs to just weigh in the morning and call for an overnight weight gain of >2 pounds or a weekly weight gain of > 5 pounds - not adding salt and has been reading food labels for sodium content; reviewed the importance of closely following a '2000mg'$  sodium diet and written information was provided - saw cardiology ([place]) 10/13/20; will defer to cardiology for medication changes  - reviewed medication uses - wearing compression socks daily -  letter provided so that she could return to work - BNP 11/05/20 was 983.1  2: HTN- - BP elevated but she got lost walking to the office and had to walk a long way and was then late to the appointment - saw PCP Tressia Miners)  - BMP 11/05/20 reviewed and showed sodium 142, potassium 3.4, creatinine 2.22. and GFR 25  3: DM with CKD- - A1c 11/03/20 was 6.0% - glucose at home was 78  4: Depression- - patient admits that she's probably feeling depressed about her health - isn't interested in taking any medications - discussed that counseling may help and she says that she's already thought about that   Medication list reviewed.   Return in 2 months or sooner for any questions/problems before then.

## 2020-11-17 ENCOUNTER — Other Ambulatory Visit: Payer: Self-pay

## 2020-11-17 ENCOUNTER — Encounter: Payer: Self-pay | Admitting: Family

## 2020-11-17 ENCOUNTER — Ambulatory Visit: Payer: No Typology Code available for payment source | Attending: Family | Admitting: Family

## 2020-11-17 VITALS — BP 152/78 | HR 56 | Resp 18 | Ht 62.0 in | Wt 168.5 lb

## 2020-11-17 DIAGNOSIS — E1122 Type 2 diabetes mellitus with diabetic chronic kidney disease: Secondary | ICD-10-CM | POA: Diagnosis not present

## 2020-11-17 DIAGNOSIS — I5022 Chronic systolic (congestive) heart failure: Secondary | ICD-10-CM | POA: Insufficient documentation

## 2020-11-17 DIAGNOSIS — I10 Essential (primary) hypertension: Secondary | ICD-10-CM

## 2020-11-17 DIAGNOSIS — Z7901 Long term (current) use of anticoagulants: Secondary | ICD-10-CM | POA: Insufficient documentation

## 2020-11-17 DIAGNOSIS — N189 Chronic kidney disease, unspecified: Secondary | ICD-10-CM | POA: Diagnosis not present

## 2020-11-17 DIAGNOSIS — I130 Hypertensive heart and chronic kidney disease with heart failure and stage 1 through stage 4 chronic kidney disease, or unspecified chronic kidney disease: Secondary | ICD-10-CM | POA: Diagnosis not present

## 2020-11-17 DIAGNOSIS — N184 Chronic kidney disease, stage 4 (severe): Secondary | ICD-10-CM

## 2020-11-17 DIAGNOSIS — R5383 Other fatigue: Secondary | ICD-10-CM | POA: Diagnosis not present

## 2020-11-17 DIAGNOSIS — Z87891 Personal history of nicotine dependence: Secondary | ICD-10-CM | POA: Diagnosis not present

## 2020-11-17 DIAGNOSIS — R0602 Shortness of breath: Secondary | ICD-10-CM | POA: Insufficient documentation

## 2020-11-17 DIAGNOSIS — Z7984 Long term (current) use of oral hypoglycemic drugs: Secondary | ICD-10-CM | POA: Insufficient documentation

## 2020-11-17 DIAGNOSIS — R0789 Other chest pain: Secondary | ICD-10-CM | POA: Diagnosis not present

## 2020-11-17 DIAGNOSIS — F329 Major depressive disorder, single episode, unspecified: Secondary | ICD-10-CM

## 2020-11-17 NOTE — Patient Instructions (Signed)
Continue weighing daily and call for an overnight weight gain of > 2 pounds or a weekly weight gain of >5 pounds. 

## 2020-12-14 NOTE — Progress Notes (Signed)
Patient ID: Caroline Walls, female    DOB: [birth year], 60 y.o.   MRN: [id number]  HPI  Ms Figuero is a 60 y/o female with a history of DM, HTN, CKD, previous tobacco use and chronic heart failure.   Echo report from 09/01/20 reviewed and showed an EF of 35% along with mild MR.   Admitted 11/03/20 due to worsening shortness of breath. Cardiology consult obtained. Initially on nonbreather mask and then transitioned off to room air. Elevated troponin thought to be due to demand ischemia. Discharged after 2 days.   She presents today for a follow-up visit with a chief complaint of moderate fatigue upon minimal exertion. She describes this as being present for several months. She has associated shortness of breath with exertion, chest tightness, light-headedness, anxiety, pedal edema and difficulty sleeping along with this. She denies any abdominal distention, palpitations, chest pain, cough or sudden weight gain.    Has been started on entresto since last here and just started taking it 5 days ago. No side effects that she's aware of. Will need patient assistance with this as her pharmacy told her it will cost her ~ $300.    Past Medical History:  Diagnosis Date  . CHF (congestive heart failure) ([place])   . Chronic kidney disease   . Diabetes mellitus without complication ([place])   . Hypertension    No past surgical history on file. Family History  Problem Relation Age of Onset  . Heart failure Brother   . Heart failure Other    Social History   Tobacco Use  . Smoking status: Former Research scientist (life sciences)  . Smokeless tobacco: Never Used  Substance Use Topics  . Alcohol use: Not Currently   Allergies  Allergen Reactions  . Penicillins Other (See Comments)    Patient get's a yeast infection every time she takes medication. Other reaction(s): Other (See Comments) Patient get's a yeast infection every time she takes medication.    Prior to Admission medications   Medication Sig Start Date End Date  Taking? Authorizing Provider  furosemide (LASIX) 40 MG tablet Take 40 mg by mouth daily. 09/03/20  Yes [provider]  glipiZIDE (GLUCOTROL XL) 2.5 MG 24 hr tablet Take 2.5 mg by mouth daily. 10/03/20  Yes [provider]  hydrALAZINE (APRESOLINE) 100 MG tablet Take 1 tablet (100 mg total) by mouth every 8 (eight) hours. 11/05/20 12/05/20 Yes Nolberto Hanlon, MD  metoprolol succinate (TOPROL-XL) 100 MG 24 hr tablet Take 100 mg by mouth daily. 10/27/20  Yes [provider]  potassium chloride SA (KLOR-CON) 20 MEQ tablet Take 1 tablet (20 mEq total) by mouth daily. 11/06/20 12/06/20 Yes Nolberto Hanlon, MD  sacubitril-valsartan (ENTRESTO) 24-26 MG Take 1 tablet by mouth 2 (two) times daily.   Yes [provider]    Review of Systems  Constitutional: Positive for fatigue (easily). Negative for appetite change.  HENT: Negative for congestion, postnasal drip and sore throat.   Eyes: Negative.   Respiratory: Positive for chest tightness (at times) and shortness of breath. Negative for cough.   Cardiovascular: Positive for leg swelling. Negative for chest pain and palpitations.  Gastrointestinal: Negative for abdominal distention and abdominal pain.  Endocrine: Negative.   Genitourinary: Negative.   Musculoskeletal: Negative for back pain and neck pain.  Skin: Negative.   Allergic/Immunologic: Negative.   Neurological: Positive for light-headedness (when waking up). Negative for dizziness.  Hematological: Negative for adenopathy. Does not bruise/bleed easily.  Psychiatric/Behavioral: Positive for sleep disturbance (trouble falling asleep;  sleeping on 1 pillow). Negative for dysphoric mood. The patient is nervous/anxious (at times).    Vitals:   12/15/20 1051  BP: (!) 176/84  Pulse: 82  Resp: 20  SpO2: 95%  Weight: 171 lb 4 oz (77.7 kg)  Height: '5\' 2"'$  (1.575 m)   Wt Readings from Last 3 Encounters:  12/15/20 171 lb 4 oz (77.7 kg)  11/17/20 168 lb 8 oz (76.4 kg)   11/05/20 174 lb 12.8 oz (79.3 kg)   Lab Results  Component Value Date   CREATININE 2.22 (H) 11/05/2020   CREATININE 2.06 (H) 11/04/2020   CREATININE 2.05 (H) 11/03/2020    Physical Exam Vitals and nursing note reviewed.  Constitutional:      Appearance: Normal appearance.  HENT:     Head: Normocephalic and atraumatic.  Cardiovascular:     Rate and Rhythm: Normal rate and regular rhythm.  Pulmonary:     Effort: Pulmonary effort is normal. No respiratory distress.     Breath sounds: No wheezing or rales.  Abdominal:     General: There is no distension.     Palpations: Abdomen is soft.  Musculoskeletal:        General: No tenderness.     Cervical back: Normal range of motion and neck supple.     Right lower leg: No tenderness. Edema (trace pitting) present.     Left lower leg: No tenderness. Edema (trace pitting) present.  Skin:    General: Skin is warm and dry.  Neurological:     General: No focal deficit present.     Mental Status: She is alert and oriented to person, place, and time.  Psychiatric:        Mood and Affect: Mood normal.        Behavior: Behavior normal.        Thought Content: Thought content normal.    Assessment & Plan:   1: Chronic heart failure with reduced ejection fraction- - NYHA class III - euvolemic today - weighing daily; reminded to call for an overnight weight gain of >2 pounds or a weekly weight gain of > 5 pounds - weight up 3 pounds from last visit here 1 month ago; says that her home weight ranges from 167-170 pounds - not adding salt and has been reading food labels for sodium content - saw cardiology ([place]) 11/29/20 & entresto was started but she just recently was able to get it - discussed future titration if renal function allows - [employer] patient assistance forms filled out today for patient - will titrate up metoprolol today to '150mg'$ ; she can take 1.5 of her current dose daily - discussed farxiga or jardiance but she says  that she's read the potential side effects and she's not interested in those at this time - wearing compression socks daily - BNP 11/05/20 was 983.1  2: HTN- - BP elevated today; is unable to afford getting a BP cuff so we will contact social worker to see if they can assist with obtaining one for the patient - saw PCP Tressia Miners) 11/23/20 - BMP 12/01/20 reviewed and showed sodium 140, potassium 3.8, creatinine 2.1, and GFR 29  3: DM with CKD- - A1c 11/03/20 was 6.0% - glucose at home was 121   Medication bottles reviewed.    Return in 1 month or sooner for any questions/problems before them.

## 2020-12-15 ENCOUNTER — Ambulatory Visit: Payer: PRIVATE HEALTH INSURANCE | Attending: Family | Admitting: Family

## 2020-12-15 ENCOUNTER — Other Ambulatory Visit: Payer: Self-pay

## 2020-12-15 ENCOUNTER — Encounter: Payer: Self-pay | Admitting: Family

## 2020-12-15 VITALS — BP 176/84 | HR 82 | Resp 20 | Ht 62.0 in | Wt 171.2 lb

## 2020-12-15 DIAGNOSIS — Z87891 Personal history of nicotine dependence: Secondary | ICD-10-CM | POA: Insufficient documentation

## 2020-12-15 DIAGNOSIS — R0789 Other chest pain: Secondary | ICD-10-CM | POA: Diagnosis not present

## 2020-12-15 DIAGNOSIS — I5022 Chronic systolic (congestive) heart failure: Secondary | ICD-10-CM | POA: Diagnosis not present

## 2020-12-15 DIAGNOSIS — Z7984 Long term (current) use of oral hypoglycemic drugs: Secondary | ICD-10-CM | POA: Diagnosis not present

## 2020-12-15 DIAGNOSIS — Z79899 Other long term (current) drug therapy: Secondary | ICD-10-CM | POA: Insufficient documentation

## 2020-12-15 DIAGNOSIS — I10 Essential (primary) hypertension: Secondary | ICD-10-CM

## 2020-12-15 DIAGNOSIS — I130 Hypertensive heart and chronic kidney disease with heart failure and stage 1 through stage 4 chronic kidney disease, or unspecified chronic kidney disease: Secondary | ICD-10-CM | POA: Insufficient documentation

## 2020-12-15 DIAGNOSIS — R5383 Other fatigue: Secondary | ICD-10-CM | POA: Insufficient documentation

## 2020-12-15 DIAGNOSIS — R0602 Shortness of breath: Secondary | ICD-10-CM | POA: Diagnosis not present

## 2020-12-15 DIAGNOSIS — Z8249 Family history of ischemic heart disease and other diseases of the circulatory system: Secondary | ICD-10-CM | POA: Insufficient documentation

## 2020-12-15 DIAGNOSIS — R42 Dizziness and giddiness: Secondary | ICD-10-CM | POA: Insufficient documentation

## 2020-12-15 DIAGNOSIS — Z7901 Long term (current) use of anticoagulants: Secondary | ICD-10-CM | POA: Diagnosis not present

## 2020-12-15 DIAGNOSIS — N189 Chronic kidney disease, unspecified: Secondary | ICD-10-CM | POA: Insufficient documentation

## 2020-12-15 DIAGNOSIS — E1122 Type 2 diabetes mellitus with diabetic chronic kidney disease: Secondary | ICD-10-CM | POA: Insufficient documentation

## 2020-12-15 DIAGNOSIS — N184 Chronic kidney disease, stage 4 (severe): Secondary | ICD-10-CM

## 2020-12-15 MED ORDER — METOPROLOL SUCCINATE ER 100 MG PO TB24: 150.0000 mg | ORAL_TABLET | Freq: Every day | ORAL | 5 refills | Status: AC

## 2020-12-15 MED ORDER — LEVOCETIRIZINE DIHYDROCHLORIDE 5 MG PO TABS: 5.0000 mg | ORAL_TABLET | Freq: Every evening | ORAL | 3 refills | Status: AC

## 2020-12-15 NOTE — Patient Instructions (Addendum)
Continue weighing daily and call for an overnight weight gain of > 2 pounds or a weekly weight gain of >5 pounds.   Increase your metoprolol to 1 1/2 tablets daily.

## 2020-12-17 ENCOUNTER — Telehealth (HOSPITAL_COMMUNITY): Payer: Self-pay | Admitting: Licensed Clinical Social Worker

## 2020-12-17 NOTE — Telephone Encounter (Signed)
CSW referred to assist patient with obtaining a BP cuff. CSW contacted patient to inform cuff will be delivered to home. Patient grateful for support and assistance. CSW available as needed. Jackie Deronda Christian, LCSW, CCSW-MCS [phone]  

## 2020-12-27 ENCOUNTER — Telehealth: Payer: Self-pay | Admitting: Family

## 2020-12-27 NOTE — Telephone Encounter (Signed)
Notified patient of her pending [employer] application for Entresto and that they need more proof of income to be able to process application. Patient said she will see what she has and let me know, however patient also stated she is not sure that she likes entresto nor if it is making a difference.   Amber, NT

## 2021-01-13 ENCOUNTER — Encounter: Payer: Self-pay | Admitting: Family

## 2021-01-19 ENCOUNTER — Ambulatory Visit: Payer: PRIVATE HEALTH INSURANCE | Admitting: Family

## 2021-01-26 ENCOUNTER — Ambulatory Visit: Payer: PRIVATE HEALTH INSURANCE | Admitting: Family

## 2021-06-14 ENCOUNTER — Telehealth: Payer: Self-pay | Admitting: Family

## 2021-06-14 NOTE — Telephone Encounter (Signed)
Called patient several times in May and again today in attempt to get 3 months worth of paystubs or a w2 to verify income for patients novart[employer] patient assistance application but have been unable to reach.

## 2021-08-11 NOTE — Progress Notes (Signed)
[facility] Note  08/15/2021     CHIEF COMPLAINT Patient presents for Retina Evaluation   HISTORY OF PRESENT ILLNESS: Caroline Walls is a 60 y.o. female who presents to the clinic today for:   HPI     Retina Evaluation   In both eyes.  I, the attending physician,  performed the HPI with the patient and updated documentation appropriately.        Comments   Patient here for retina Evaluation. Referred by Dr. Kathlen Mody.  Patient states vision not the greatest. Always had struggles. Possible clearance for cataract surgery. No eye pain.       Last edited by Bernarda Caffey, MD on 08/15/2021  2:19 PM.    Pt is here on the referral of Dr. Kathlen Mody for concern of NPDR OU, pts last A1c was 7.9 on 4.29.22, pt states she has not been on insulin in 2 years  Referring physician: Arnoldo Hooker, MD [place],  [place] [id number]  HISTORICAL INFORMATION:  Selected notes from the MEDICAL RECORD NUMBER Referred by Dr. Kathlen Mody for eval of DME OD LEE: 10.18.22 Ocular Hx-NPDR OD, Cataracts PMH-DM    CURRENT MEDICATIONS: No current outpatient medications on file. (Ophthalmic Drugs)   No current facility-administered medications for this visit. (Ophthalmic Drugs)   Current Outpatient Medications (Other)  Medication Sig   furosemide (LASIX) 40 MG tablet Take 40 mg by mouth daily.   glipiZIDE (GLUCOTROL XL) 2.5 MG 24 hr tablet Take 2.5 mg by mouth daily.   levocetirizine (XYZAL) 5 MG tablet Take 1 tablet (5 mg total) by mouth every evening.   metoprolol succinate (TOPROL-XL) 100 MG 24 hr tablet Take 1.5 tablets (150 mg total) by mouth daily.   Oxcarbazepine (TRILEPTAL) 300 MG tablet Take 300 mg by mouth 2 (two) times daily.   PHOSPHATIDYLSERINE PO Take 500 mg by mouth daily.   Prasterone (DEHYDROEPIANDROSTERONE) POWD Take 5 mg by mouth daily.   progesterone (PROMETRIUM) 100 MG capsule Take 100 mg by mouth daily.   sacubitril-valsartan (ENTRESTO) 24-26  MG Take 1 tablet by mouth 2 (two) times daily.   hydrALAZINE (APRESOLINE) 100 MG tablet Take 1 tablet (100 mg total) by mouth every 8 (eight) hours.   potassium chloride SA (KLOR-CON) 20 MEQ tablet Take 1 tablet (20 mEq total) by mouth daily.   No current facility-administered medications for this visit. (Other)   REVIEW OF SYSTEMS: ROS   Positive for: Endocrine, Cardiovascular, Eyes, Respiratory Negative for: Constitutional, Gastrointestinal, Neurological, Skin, Genitourinary, Musculoskeletal, HENT, Psychiatric, Allergic/Imm, Heme/Lymph Last edited by Leonie Douglas, COA on 08/15/2021  1:58 PM.     ALLERGIES Allergies  Allergen Reactions   Penicillins Other (See Comments)    Patient get's a yeast infection every time she takes medication. Other reaction(s): Other (See Comments) Patient get's a yeast infection every time she takes medication.    PAST MEDICAL HISTORY Past Medical History:  Diagnosis Date   CHF (congestive heart failure) ([place])    Chronic kidney disease    Diabetes mellitus without complication ([place])    Hypertension    History reviewed. No pertinent surgical history.  FAMILY HISTORY Family History  Problem Relation Age of Onset   Heart failure Brother    Heart failure Other    SOCIAL HISTORY Social History   Tobacco Use   Smoking status: Former   Smokeless tobacco: Never  Substance Use Topics   Alcohol use: Not Currently   Drug use: Not Currently  OPHTHALMIC EXAM: Base Eye Exam     Visual Acuity (Snellen - Linear)       Right Left   Dist cc 20/40 -1 20/50 -2   Dist ph cc NI 20/25 -2    Correction: Glasses         Tonometry (Tonopen, 1:49 PM)       Right Left   Pressure 19 18         Pupils       Dark Light Shape React APD   Right 3 2 Round Brisk None   Left 3 2 Round Brisk None         Visual Fields       Left Right    Full Full         Extraocular Movement       Right Left    Full, Ortho Full, Ortho          Neuro/Psych     Oriented x3: Yes   Mood/Affect: Normal         Dilation     Both eyes: 1.0% Mydriacyl, 2.5% Phenylephrine @ 1:49 PM         Dilation #2     Left eye: 1.0% Mydriacyl, 2.5% Phenylephrine @ 2:08 PM         Dilation Comments   Re-dilated OS          Slit Lamp and Fundus Exam     External Exam       Right Left   External Normal Normal         Slit Lamp Exam       Right Left   Lids/Lashes Normal Normal   Conjunctiva/Sclera mild melanosis mild melanosis   Cornea arcus, trace PEE arcus, trace PEE   Anterior Chamber Deep and quiet Deep and quiet   Iris Round and dilated, No NVI Round and dilated, No NVI   Lens 2+ Nuclear sclerosis with early brunescence, 2+ Cortical cataract, +Vacuoles, 1+ Posterior subcapsular cataract 2+ Nuclear sclerosis with early brunescence, 2+ Cortical cataract, +Vacuoles   Vitreous Vitreous syneresis Vitreous syneresis, Posterior vitreous detachment         Fundus Exam       Right Left   Disc Pink and Sharp Pink and Sharp   C/D Ratio 0.4 0.5   Macula Flat, Good foveal reflex, focal exudate IT fovea. Trace punctate MA Flat, Good foveal reflex, mils focal exudate ST fovea, trace punctate MA   Vessels attenuated, Tortuous, mild AV crossing changes attenuated, Tortuous, mild AV crossing changes, ST venpus dilation   Periphery Attached, No heme  Attached, No heme            Refraction     Wearing Rx       Sphere Cylinder Axis   Right -4.25 Sphere    Left -4.50 +1.75 094    Type: SVL         Manifest Refraction       Sphere Cylinder Axis Dist VA   Right -4.75 +0.50 165 20/40+2   Left -4.50 +0.75 095 20/30-1           IMAGING AND PROCEDURES  Imaging and Procedures for 08/15/2021  OCT, Retina - OU - Both Eyes       Right Eye Quality was good. Central Foveal Thickness: 259. Progression has no prior data. Findings include no IRF, no SRF, intraretinal hyper-reflective material (Focal cystic  changes and IRHM temporal fovea).   Left Eye Quality  was good. Central Foveal Thickness: 245. Progression has no prior data. Findings include normal foveal contour, no IRF, intraretinal hyper-reflective material (Trace cystic changes and IRHM ST fovea).   Notes *Images captured and stored on drive  Diagnosis / Impression:  OD: Focal cystic changes and IRHM temporal fovea OS: Trace cystic changes and IRHM ST fovea  Clinical management:  See below  Abbreviations: NFP - Normal foveal profile. CME - cystoid macular edema. PED - pigment epithelial detachment. IRF - intraretinal fluid. SRF - subretinal fluid. EZ - ellipsoid zone. ERM - epiretinal membrane. ORA - outer retinal atrophy. ORT - outer retinal tubulation. SRHM - subretinal hyper-reflective material. IRHM - intraretinal hyper-reflective material            ASSESSMENT/PLAN:    ICD-10-CM   1. 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 Retinal edema  H35.81 OCT, Retina - OU - Both Eyes    3. Essential hypertension  I10     4. Hypertensive retinopathy of both eyes  H35.033     5. Combined forms of age-related cataract of both eyes  H25.813       1,2. Moderate non-proliferative diabetic retinopathy, both eyes - The incidence, risk factors for progression, natural history and treatment options for diabetic retinopathy were discussed with patient.   - The need for close monitoring of blood glucose, blood pressure, and serum lipids, avoiding cigarette or any type of tobacco, and the need for long term follow up was also discussed with patient. - exam shows mild MA OU -- mostly in macula, no NV OU - OCT shows mild cystic changes / diabetic macular edema, both eyes  - The natural history, pathology, and characteristics of diabetic macular edema discussed with patient.  A generalized discussion of the major clinical trials  concerning treatment of diabetic macular edema (ETDRS, DCT, SCORE, RISE / RIDE, and ongoing DRCR net studies) was completed.  This discussion included mention of the various approaches to treating diabetic macular edema (observation, laser photocoagulation, anti-VEGF injections with lucentis / Avastin / Eylea, steroid injections with Kenalog / Ozurdex, and intraocular surgery with vitrectomy).  The goal hemoglobin A1C of 6-7 was discussed, as well as importance of smoking cessation and hypertension control.  Need for ongoing treatment and monitoring were specifically discussed with reference to chronic nature of diabetic macular edema. - no retinal intervention recommended at this time -- monitor - f/u in 3 months -- DFE/OCT/FA transit OD, possible injection  3,4. Hypertensive retinopathy OU - discussed importance of tight BP controls - monitor  5. Mixed Cataract OU - The symptoms of cataract, surgical options, and treatments and risks were discussed with patient. - discussed diagnosis and progression - under the expert management of [facility] - clear from a retina standpoint to proceed with cataract surgery when pt and surgeon are ready   Ophthalmic Meds Ordered this visit:  No orders of the defined types were placed in this encounter.    Return for f/u 3 months, NPDR OU, DFE, OCT, POV.  There are no Patient Instructions on file for this visit.  This document serves as a record of services personally performed by Gardiner Sleeper, MD, PhD. It was created on their behalf by Leeann Must, [place], an ophthalmic technician. The creation of this record is the provider's dictation and/or activities during the visit.    Electronically signed by: Leeann Must, [place] @  TODAY@ 12:32 PM  This document serves as a record of services personally performed by Gardiner Sleeper, MD, PhD. It was created on their behalf by San Jetty. Owens Shark, OA an ophthalmic technician. The creation of this record is the  provider's dictation and/or activities during the visit.    Electronically signed by: San Jetty. Owens Shark, [place] 10.31.2022 12:32 PM  Gardiner Sleeper, M.D., Ph.D. Diseases & Surgery of the Retina and [place] 08/15/2021   I have reviewed the above documentation for accuracy and completeness, and I agree with the above. Gardiner Sleeper, M.D., Ph.D. 08/16/21 12:32 PM  Abbreviations: M myopia (nearsighted); A astigmatism; H hyperopia (farsighted); P presbyopia; Mrx spectacle prescription;  CTL contact lenses; OD right eye; OS left eye; OU both eyes  XT exotropia; ET esotropia; PEK punctate epithelial keratitis; PEE punctate epithelial erosions; DES dry eye syndrome; MGD meibomian gland dysfunction; ATs artificial tears; PFAT's preservative free artificial tears; [place] nuclear sclerotic cataract; PSC posterior subcapsular cataract; ERM epi-retinal membrane; PVD posterior vitreous detachment; RD retinal detachment; DM diabetes mellitus; DR diabetic retinopathy; NPDR non-proliferative diabetic retinopathy; PDR proliferative diabetic retinopathy; CSME clinically significant macular edema; DME diabetic macular edema; dbh dot blot hemorrhages; CWS cotton wool spot; POAG primary open angle glaucoma; C/D cup-to-disc ratio; HVF humphrey visual field; GVF goldmann visual field; OCT optical coherence tomography; IOP intraocular pressure; BRVO Branch retinal vein occlusion; CRVO central retinal vein occlusion; CRAO central retinal artery occlusion; BRAO branch retinal artery occlusion; RT retinal tear; SB scleral buckle; PPV pars plana vitrectomy; VH Vitreous hemorrhage; PRP panretinal laser photocoagulation; IVK intravitreal kenalog; VMT vitreomacular traction; MH Macular hole;  NVD neovascularization of the disc; NVE neovascularization elsewhere; AREDS age related eye disease study; ARMD age related macular degeneration; POAG primary open angle glaucoma; EBMD epithelial/anterior basement  membrane dystrophy; ACIOL anterior chamber intraocular lens; IOL intraocular lens; PCIOL posterior chamber intraocular lens; Phaco/IOL phacoemulsification with intraocular lens placement; [place] photorefractive keratectomy; LASIK laser assisted in situ keratomileusis; HTN hypertension; DM diabetes mellitus; COPD chronic obstructive pulmonary disease

## 2021-08-15 ENCOUNTER — Other Ambulatory Visit: Payer: Self-pay

## 2021-08-15 ENCOUNTER — Encounter (INDEPENDENT_AMBULATORY_CARE_PROVIDER_SITE_OTHER): Payer: Self-pay | Admitting: Ophthalmology

## 2021-08-15 ENCOUNTER — Ambulatory Visit (INDEPENDENT_AMBULATORY_CARE_PROVIDER_SITE_OTHER): Payer: BC Managed Care – PPO | Admitting: Ophthalmology

## 2021-08-15 DIAGNOSIS — H3581 Retinal edema: Secondary | ICD-10-CM

## 2021-08-15 DIAGNOSIS — H35033 Hypertensive retinopathy, bilateral: Secondary | ICD-10-CM | POA: Diagnosis not present

## 2021-08-15 DIAGNOSIS — E113213 Type 2 diabetes mellitus with mild nonproliferative diabetic retinopathy with macular edema, bilateral: Secondary | ICD-10-CM

## 2021-08-15 DIAGNOSIS — H25813 Combined forms of age-related cataract, bilateral: Secondary | ICD-10-CM

## 2021-08-15 DIAGNOSIS — I10 Essential (primary) hypertension: Secondary | ICD-10-CM

## 2021-11-15 ENCOUNTER — Encounter (INDEPENDENT_AMBULATORY_CARE_PROVIDER_SITE_OTHER): Payer: Self-pay

## 2021-11-15 ENCOUNTER — Encounter (INDEPENDENT_AMBULATORY_CARE_PROVIDER_SITE_OTHER): Payer: BC Managed Care – PPO | Admitting: Ophthalmology

## 2021-11-23 ENCOUNTER — Other Ambulatory Visit: Payer: Self-pay

## 2021-11-30 NOTE — Progress Notes (Signed)
[facility] Note  12/07/2021     CHIEF COMPLAINT Patient presents for Retina Follow Up    HISTORY OF PRESENT ILLNESS: Caroline Walls is a 61 y.o. female who presents to the clinic today for:   HPI     Retina Follow Up   Patient presents with  Diabetic Retinopathy.  In both eyes.  Severity is moderate.  Duration of 3 months.  Since onset it is stable.  I, the attending physician,  performed the HPI with the patient and updated documentation appropriately.        Comments   Pt here for 3 mo ret f/u NPDR OU. Pt states VA doesn't seem as strong as it was before. She reports some watering OU the past few months and reports its probably her allergies. She uses Systane OU 3-4 times weekly. Last A1C was taken yesterday and it is 6.8 which is down from 9 in Nov.       Last edited by Bernarda Caffey, MD on 12/09/2021 12:16 AM.    Pt states her A1c has improved to 6.8 from 9.0 in November, she is on [place] and was just started on Ozempic, she states she is tolerating it well, but is "not convinced" she needs to be on it, she states she has changed her diet to help with her blood sugar numbers  Referring physician: Lisabeth Pick, MD [place],  [place] [address]  HISTORICAL INFORMATION:  Selected notes from the MEDICAL RECORD NUMBER Referred by Dr. Kathlen Mody for eval of DME OD LEE: 10.18.22 Ocular Hx-NPDR OD, Cataracts PMH-DM    CURRENT MEDICATIONS: No current outpatient medications on file. (Ophthalmic Drugs)   No current facility-administered medications for this visit. (Ophthalmic Drugs)   Current Outpatient Medications (Other)  Medication Sig   glipiZIDE (GLUCOTROL XL) 2.5 MG 24 hr tablet Take 2.5 mg by mouth daily. Pt takes when needed   levocetirizine (XYZAL) 5 MG tablet Take 1 tablet (5 mg total) by mouth every evening.   metoprolol succinate (TOPROL-XL) 100 MG 24 hr tablet Take 1.5 tablets (150 mg total) by mouth daily. (Patient  taking differently: Take 50 mg by mouth daily. Only taking 21m QD)   sacubitril-valsartan (ENTRESTO) 24-26 MG Take 1 tablet by mouth 2 (two) times daily.   furosemide (LASIX) 40 MG tablet Take 40 mg by mouth daily. (Patient not taking: Reported on 12/07/2021)   hydrALAZINE (APRESOLINE) 100 MG tablet Take 1 tablet (100 mg total) by mouth every 8 (eight) hours.   Oxcarbazepine (TRILEPTAL) 300 MG tablet Take 300 mg by mouth 2 (two) times daily. (Patient not taking: Reported on 12/07/2021)   PHOSPHATIDYLSERINE PO Take 500 mg by mouth daily. (Patient not taking: Reported on 12/07/2021)   potassium chloride SA (KLOR-CON) 20 MEQ tablet Take 1 tablet (20 mEq total) by mouth daily.   Prasterone (DEHYDROEPIANDROSTERONE) POWD Take 5 mg by mouth daily. (Patient not taking: Reported on 12/07/2021)   progesterone (PROMETRIUM) 100 MG capsule Take 100 mg by mouth daily. (Patient not taking: Reported on 12/07/2021)   No current facility-administered medications for this visit. (Other)   REVIEW OF SYSTEMS: ROS   Positive for: Endocrine, Cardiovascular, Eyes, Respiratory Negative for: Constitutional, Gastrointestinal, Neurological, Skin, Genitourinary, Musculoskeletal, HENT, Psychiatric, Allergic/Imm, Heme/Lymph Last edited by SKingsley Spittle COT on 12/07/2021  1:21 PM.     ALLERGIES Allergies  Allergen Reactions   Penicillins Other (See Comments)    Patient get's a yeast infection every time  she takes medication. Other reaction(s): Other (See Comments) Patient get's a yeast infection every time she takes medication.    PAST MEDICAL HISTORY Past Medical History:  Diagnosis Date   CHF (congestive heart failure) ([place])    Chronic kidney disease    Diabetes mellitus without complication ([place])    Hypertension    History reviewed. No pertinent surgical history.  FAMILY HISTORY Family History  Problem Relation Age of Onset   Heart failure Brother    Heart failure Other    SOCIAL HISTORY Social  History   Tobacco Use   Smoking status: Former   Smokeless tobacco: Never  Substance Use Topics   Alcohol use: Not Currently   Drug use: Not Currently       OPHTHALMIC EXAM: Base Eye Exam     Visual Acuity (Snellen - Linear)       Right Left   Dist cc 20/40 -2 20/25 -1   Dist ph cc NI NI    Correction: Glasses         Tonometry (Tonopen, 1:39 PM)       Right Left   Pressure 18 17         Pupils       Dark Light Shape React APD   Right 3 2 Round Brisk None   Left 3 2 Round Brisk None         Visual Fields (Counting fingers)       Left Right    Full Full         Extraocular Movement       Right Left    Full, Ortho Full, Ortho         Neuro/Psych     Oriented x3: Yes   Mood/Affect: Normal         Dilation     Both eyes: 1.0% Mydriacyl, 2.5% Phenylephrine @ 1:37 PM           Slit Lamp and Fundus Exam     External Exam       Right Left   External Normal Normal         Slit Lamp Exam       Right Left   Lids/Lashes Normal Normal   Conjunctiva/Sclera mild melanosis mild melanosis   Cornea arcus, trace PEE arcus, trace PEE   Anterior Chamber Deep and quiet Deep and quiet   Iris Round and dilated, No NVI Round and dilated, No NVI   Lens 2+ Nuclear sclerosis with early brunescence, 2+ Cortical cataract, +Vacuoles, 1+ Posterior subcapsular cataract 2+ Nuclear sclerosis with early brunescence, 2+ Cortical cataract, +Vacuoles   Anterior Vitreous Vitreous syneresis Vitreous syneresis, Posterior vitreous detachment         Fundus Exam       Right Left   Disc Pink and Sharp Pink and Sharp   C/D Ratio 0.4 0.5   Macula Flat, Good foveal reflex, focal exudate IT fovea, trace punctate MA Flat, Good foveal reflex, mild focal exudate ST fovea, trace punctate MA   Vessels attenuated, Tortuous attenuated, Tortuous, mild AV crossing changes, ST venpus dilation   Periphery Attached, No heme Attached, No heme            Refraction      Wearing Rx       Sphere Cylinder Axis   Right -4.25 Sphere    Left -4.50 +1.75 094    Type: SVL           IMAGING AND PROCEDURES  Imaging and Procedures for 12/07/2021  OCT, Retina - OU - Both Eyes       Right Eye Quality was good. Central Foveal Thickness: 259. Progression has been stable. Findings include no SRF, intraretinal hyper-reflective material, normal foveal contour, intraretinal fluid (Focal cystic changes and IRHM temporal fovea - persistent).   Left Eye Quality was good. Central Foveal Thickness: 246. Progression has been stable. Findings include normal foveal contour, no IRF, intraretinal hyper-reflective material, no SRF (Trace cystic changes and IRHM ST fovea -- interval increase in [facility]).   Notes *Images captured and stored on drive  Diagnosis / Impression:  OD: Focal cystic changes and IRHM temporal fovea OS: Trace cystic changes and IRHM ST fovea -- interval increase in [facility]  Clinical management:  See below  Abbreviations: NFP - Normal foveal profile. CME - cystoid macular edema. PED - pigment epithelial detachment. IRF - intraretinal fluid. SRF - subretinal fluid. EZ - ellipsoid zone. ERM - epiretinal membrane. ORA - outer retinal atrophy. ORT - outer retinal tubulation. SRHM - subretinal hyper-reflective material. IRHM - intraretinal hyper-reflective material      Fluorescein Angiography Optos (Transit OD)       Right Eye Progression has no prior data. Early phase findings include microaneurysm. Mid/Late phase findings include microaneurysm, staining, leakage (Trace MA SN macula, mild focal staining / leakage IT to fovea).   Left Eye Progression has no prior data. Early phase findings include microaneurysm. Mid/Late phase findings include microaneurysm, leakage (Rare MA, focal MA with mild leakage superior to fovea).   Notes **Images stored on drive**  Impression: NPDR OU OD: Trace MA SN macula, mild focal staining / leakage IT to  fovea OS: Rare MA-- focal MA with mild leakage superior to fovea             ASSESSMENT/PLAN:    ICD-10-CM   1. Both eyes affected by mild nonproliferative diabetic retinopathy with macular edema, associated with type 2 diabetes mellitus ([facility])  Z00.1749 OCT, Retina - OU - Both Eyes    2. Essential hypertension  I10     3. Hypertensive retinopathy of both eyes  H35.033 Fluorescein Angiography Optos (Transit OD)    4. Combined forms of age-related cataract of both eyes  H25.813        1,2. Moderate non-proliferative diabetic retinopathy, both eyes - exam shows mild MA OU -- mostly in macula, no NV OU - OCT shows mild cystic changes / diabetic macular edema, both eyes  - FA (02.22.23) shows OD: Trace MA SN macula, mild focal staining / leakage IT to fovea; OS: Rare MA, focal MA with mild leakage superior to fovea - no retinal intervention recommended at this time -- monitor - f/u in 4 months -- DFE/OCT, possible injection  3,4. Hypertensive retinopathy OU - discussed importance of tight BP controls - monitor  5. Mixed Cataract OU - The symptoms of cataract, surgical options, and treatments and risks were discussed with patient. - discussed diagnosis and progression - under the expert management of [facility] - clear from a retina standpoint to proceed with cataract surgery when pt and surgeon are ready   Ophthalmic Meds Ordered this visit:  No orders of the defined types were placed in this encounter.     Return in about 4 months (around 04/06/2022) for f/u NPDR OU, DFE, OCT.  There are no Patient Instructions on file for this visit.  This document serves as a record of services personally performed by Gardiner Sleeper, MD,  PhD. It was created on their behalf by Orvan Falconer, an ophthalmic technician. The creation of this record is the provider's dictation and/or activities during the visit.    Electronically signed by: Orvan Falconer, OA, 12/09/21  12:18  AM  This document serves as a record of services personally performed by Gardiner Sleeper, MD, PhD. It was created on their behalf by San Jetty. Owens Shark, OA an ophthalmic technician. The creation of this record is the provider's dictation and/or activities during the visit.    Electronically signed by: San Jetty. Owens Shark, [place] 02.22.2023 12:18 AM  Gardiner Sleeper, M.D., Ph.D. Diseases & Surgery of the Retina and Vitreous [facility]  I have reviewed the above documentation for accuracy and completeness, and I agree with the above. Gardiner Sleeper, M.D., Ph.D. 12/09/21 12:19 AM   Abbreviations: M myopia (nearsighted); A astigmatism; H hyperopia (farsighted); P presbyopia; Mrx spectacle prescription;  CTL contact lenses; OD right eye; OS left eye; OU both eyes  XT exotropia; ET esotropia; PEK punctate epithelial keratitis; PEE punctate epithelial erosions; DES dry eye syndrome; MGD meibomian gland dysfunction; ATs artificial tears; PFAT's preservative free artificial tears; [place] nuclear sclerotic cataract; PSC posterior subcapsular cataract; ERM epi-retinal membrane; PVD posterior vitreous detachment; RD retinal detachment; DM diabetes mellitus; DR diabetic retinopathy; NPDR non-proliferative diabetic retinopathy; PDR proliferative diabetic retinopathy; CSME clinically significant macular edema; DME diabetic macular edema; dbh dot blot hemorrhages; CWS cotton wool spot; POAG primary open angle glaucoma; C/D cup-to-disc ratio; HVF humphrey visual field; GVF goldmann visual field; OCT optical coherence tomography; IOP intraocular pressure; BRVO Branch retinal vein occlusion; CRVO central retinal vein occlusion; CRAO central retinal artery occlusion; BRAO branch retinal artery occlusion; RT retinal tear; SB scleral buckle; PPV pars plana vitrectomy; VH Vitreous hemorrhage; PRP panretinal laser photocoagulation; IVK intravitreal kenalog; VMT vitreomacular traction; MH Macular hole;  NVD  neovascularization of the disc; NVE neovascularization elsewhere; AREDS age related eye disease study; ARMD age related macular degeneration; POAG primary open angle glaucoma; EBMD epithelial/anterior basement membrane dystrophy; ACIOL anterior chamber intraocular lens; IOL intraocular lens; PCIOL posterior chamber intraocular lens; Phaco/IOL phacoemulsification with intraocular lens placement; [place] photorefractive keratectomy; LASIK laser assisted in situ keratomileusis; HTN hypertension; DM diabetes mellitus; COPD chronic obstructive pulmonary disease

## 2021-12-06 ENCOUNTER — Encounter (INDEPENDENT_AMBULATORY_CARE_PROVIDER_SITE_OTHER): Payer: BC Managed Care – PPO | Admitting: Ophthalmology

## 2021-12-07 ENCOUNTER — Other Ambulatory Visit: Payer: Self-pay

## 2021-12-07 ENCOUNTER — Ambulatory Visit (INDEPENDENT_AMBULATORY_CARE_PROVIDER_SITE_OTHER): Payer: BC Managed Care – PPO | Admitting: Ophthalmology

## 2021-12-07 ENCOUNTER — Encounter (INDEPENDENT_AMBULATORY_CARE_PROVIDER_SITE_OTHER): Payer: Self-pay | Admitting: Ophthalmology

## 2021-12-07 DIAGNOSIS — H35033 Hypertensive retinopathy, bilateral: Secondary | ICD-10-CM | POA: Diagnosis not present

## 2021-12-07 DIAGNOSIS — H25813 Combined forms of age-related cataract, bilateral: Secondary | ICD-10-CM

## 2021-12-07 DIAGNOSIS — E113213 Type 2 diabetes mellitus with mild nonproliferative diabetic retinopathy with macular edema, bilateral: Secondary | ICD-10-CM | POA: Diagnosis not present

## 2021-12-07 DIAGNOSIS — I10 Essential (primary) hypertension: Secondary | ICD-10-CM | POA: Diagnosis not present

## 2021-12-09 ENCOUNTER — Encounter (INDEPENDENT_AMBULATORY_CARE_PROVIDER_SITE_OTHER): Payer: Self-pay | Admitting: Ophthalmology

## 2022-01-26 LAB — COLOGUARD: COLOGUARD: NEGATIVE

## 2022-01-26 LAB — EXTERNAL GENERIC LAB PROCEDURE: COLOGUARD: NEGATIVE

## 2022-04-05 ENCOUNTER — Encounter (INDEPENDENT_AMBULATORY_CARE_PROVIDER_SITE_OTHER): Payer: BC Managed Care – PPO | Admitting: Ophthalmology

## 2022-04-05 DIAGNOSIS — H35033 Hypertensive retinopathy, bilateral: Secondary | ICD-10-CM

## 2022-04-05 DIAGNOSIS — E113213 Type 2 diabetes mellitus with mild nonproliferative diabetic retinopathy with macular edema, bilateral: Secondary | ICD-10-CM

## 2022-04-05 DIAGNOSIS — I10 Essential (primary) hypertension: Secondary | ICD-10-CM

## 2022-04-05 DIAGNOSIS — H25813 Combined forms of age-related cataract, bilateral: Secondary | ICD-10-CM

## 2022-04-11 ENCOUNTER — Other Ambulatory Visit: Payer: Self-pay | Admitting: Family

## 2022-05-30 NOTE — Progress Notes (Shared)
[facility] Note  05/31/2022     CHIEF COMPLAINT Patient presents for Retina Follow Up    HISTORY OF PRESENT ILLNESS: Caroline Walls is a 61 y.o. female who presents to the clinic today for:   HPI     Retina Follow Up           Diagnosis: Diabetic Retinopathy   Laterality: both eyes   Severity: moderate   Duration: 6 months   Course: stable   MD Performed: performed the HPI with the patient and updated documentation appropriately         Comments   Pt here for 6 mo ret f/u for NPDR OU. Pt states VA is about the same, maybe a bit weaker. Has not been to Dr. Lucianne Lei for cataract sx consult, scheduled in October.       Last edited by Kingsley Spittle, COT on 05/31/2022  1:46 PM.    Pt states vision "is not getting better", her A1c was 6.3 on 08.09.23, she takes Ozempic once a week and is watching what she eats  Referring physician: Gladstone Lighter, MD [place],   [id number]  HISTORICAL INFORMATION:  Selected notes from the MEDICAL RECORD NUMBER Referred by Dr. Kathlen Mody for eval of DME OD LEE: 10.18.22 Ocular Hx-NPDR OD, Cataracts PMH-DM    CURRENT MEDICATIONS: No current outpatient medications on file. (Ophthalmic Drugs)   No current facility-administered medications for this visit. (Ophthalmic Drugs)   Current Outpatient Medications (Other)  Medication Sig   dapagliflozin propanediol (FARXIGA) 5 MG TABS tablet Take 5 mg by mouth daily.   hydrALAZINE (APRESOLINE) 50 MG tablet Take 50 mg by mouth 4 (four) times daily.   levocetirizine (XYZAL) 5 MG tablet Take 1 tablet (5 mg total) by mouth every evening.   metoprolol succinate (TOPROL-XL) 100 MG 24 hr tablet Take 1.5 tablets (150 mg total) by mouth daily. (Patient taking differently: Take 50 mg by mouth daily. Only taking 35m QD)   sacubitril-valsartan (ENTRESTO) 24-26 MG Take 1 tablet by mouth 2 (two) times daily.   Semaglutide,0.25 or 0.5MG/DOS, (OZEMPIC, 0.25  OR 0.5 MG/DOSE,) 2 MG/1.5ML SOPN Inject into the skin. Weekly   spironolactone (ALDACTONE) 12.5 mg TABS tablet Take 12.5 mg by mouth daily.   furosemide (LASIX) 40 MG tablet Take 40 mg by mouth daily. (Patient not taking: Reported on 12/07/2021)   glipiZIDE (GLUCOTROL XL) 2.5 MG 24 hr tablet Take 2.5 mg by mouth daily. Pt takes when needed (Patient not taking: Reported on 05/31/2022)   hydrALAZINE (APRESOLINE) 100 MG tablet Take 1 tablet (100 mg total) by mouth every 8 (eight) hours.   Oxcarbazepine (TRILEPTAL) 300 MG tablet Take 300 mg by mouth 2 (two) times daily. (Patient not taking: Reported on 12/07/2021)   PHOSPHATIDYLSERINE PO Take 500 mg by mouth daily. (Patient not taking: Reported on 12/07/2021)   potassium chloride SA (KLOR-CON) 20 MEQ tablet Take 1 tablet (20 mEq total) by mouth daily.   Prasterone (DEHYDROEPIANDROSTERONE) POWD Take 5 mg by mouth daily. (Patient not taking: Reported on 05/31/2022)   progesterone (PROMETRIUM) 100 MG capsule Take 100 mg by mouth daily. (Patient not taking: Reported on 12/07/2021)   No current facility-administered medications for this visit. (Other)   REVIEW OF SYSTEMS: ROS   Positive for: Endocrine, Cardiovascular, Eyes, Respiratory Negative for: Constitutional, Gastrointestinal, Neurological, Skin, Genitourinary, Musculoskeletal, HENT, Psychiatric, Allergic/Imm, Heme/Lymph Last edited by SKingsley Spittle COT on 05/31/2022  1:46 PM.  ALLERGIES Allergies  Allergen Reactions   Penicillins Other (See Comments)    Patient get's a yeast infection every time she takes medication. Other reaction(s): Other (See Comments) Patient get's a yeast infection every time she takes medication.    PAST MEDICAL HISTORY Past Medical History:  Diagnosis Date   CHF (congestive heart failure) ([place])    Chronic kidney disease    Diabetes mellitus without complication ([place])    Hypertension    History reviewed. No pertinent surgical history.  FAMILY  HISTORY Family History  Problem Relation Age of Onset   Heart failure Brother    Heart failure Other    SOCIAL HISTORY Social History   Tobacco Use   Smoking status: Former   Smokeless tobacco: Never  Substance Use Topics   Alcohol use: Not Currently   Drug use: Not Currently       OPHTHALMIC EXAM: Base Eye Exam     Visual Acuity (Snellen - Linear)       Right Left   Dist cc 20/40 +2 20/25 -1   Dist ph cc NI NI    Correction: Glasses         Tonometry (Tonopen, 1:55 PM)       Right Left   Pressure 16 14         Pupils       Dark Light Shape React APD   Right 3 2 Round Brisk None   Left 3 2 Round Brisk None         Visual Fields (Counting fingers)       Left Right    Full Full         Extraocular Movement       Right Left    Full, Ortho Full, Ortho         Neuro/Psych     Oriented x3: Yes   Mood/Affect: Normal         Dilation     Both eyes: 1.0% Mydriacyl @ 1:56 PM           Refraction     Wearing Rx       Sphere Cylinder Axis   Right -4.25 Sphere    Left -4.50 +1.75 094    Type: SVL           IMAGING AND PROCEDURES  Imaging and Procedures for 05/31/2022           ASSESSMENT/PLAN:    ICD-10-CM   1. Both eyes affected by mild nonproliferative diabetic retinopathy with macular edema, associated with type 2 diabetes mellitus (HCC)  H84.6962 OCT, Retina - OU - Both Eyes    2. Essential hypertension  I10     3. Hypertensive retinopathy of both eyes  H35.033     4. Combined forms of age-related cataract of both eyes  H25.813         1,2. Moderate non-proliferative diabetic retinopathy, both eyes  - delayed from 4 months to 6 months  - last A1c was 6.3 on 08.09.23 - exam shows mild MA OU -- mostly in macula, no NV OU - OCT shows OD: Focal cystic changes and IRHM temporal fovea; OS: Trace cystic changes and IRHM ST fovea -- slightly improved - FA (02.22.23) shows OD: Trace MA SN macula, mild focal  staining / leakage IT to fovea; OS: Rare MA, focal MA with mild leakage superior to fovea - no retinal intervention recommended at this time -- monitor - f/u in 3 months -- DFE/OCT, possible  injection  3,4. Hypertensive retinopathy OU - discussed importance of tight BP controls - monitor  5. Mixed Cataract OU - The symptoms of cataract, surgical options, and treatments and risks were discussed with patient. - discussed diagnosis and progression - under the expert management of Dr. Lucianne Lei - surgery scheduled for October 2023  Ophthalmic Meds Ordered this visit:  No orders of the defined types were placed in this encounter.     No follow-ups on file.  There are no Patient Instructions on file for this visit.  This document serves as a record of services personally performed by Gardiner Sleeper, MD, PhD. It was created on their behalf by Orvan Falconer, an ophthalmic technician. The creation of this record is the provider's dictation and/or activities during the visit.    Electronically signed by: Orvan Falconer, OA, 05/31/22  2:09 PM  This document serves as a record of services personally performed by Gardiner Sleeper, MD, PhD. It was created on their behalf by San Jetty. Owens Shark, OA an ophthalmic technician. The creation of this record is the provider's dictation and/or activities during the visit.    Electronically signed by: San Jetty. Owens Shark, [place] 08.16.2023 2:09 PM    Gardiner Sleeper, M.D., Ph.D. Diseases & Surgery of the Retina and Vitreous [facility]: M myopia (nearsighted); A astigmatism; H hyperopia (farsighted); P presbyopia; Mrx spectacle prescription;  CTL contact lenses; OD right eye; OS left eye; OU both eyes  XT exotropia; ET esotropia; PEK punctate epithelial keratitis; PEE punctate epithelial erosions; DES dry eye syndrome; MGD meibomian gland dysfunction; ATs artificial tears; PFAT's preservative free artificial tears; [place]  nuclear sclerotic cataract; PSC posterior subcapsular cataract; ERM epi-retinal membrane; PVD posterior vitreous detachment; RD retinal detachment; DM diabetes mellitus; DR diabetic retinopathy; NPDR non-proliferative diabetic retinopathy; PDR proliferative diabetic retinopathy; CSME clinically significant macular edema; DME diabetic macular edema; dbh dot blot hemorrhages; CWS cotton wool spot; POAG primary open angle glaucoma; C/D cup-to-disc ratio; HVF humphrey visual field; GVF goldmann visual field; OCT optical coherence tomography; IOP intraocular pressure; BRVO Branch retinal vein occlusion; CRVO central retinal vein occlusion; CRAO central retinal artery occlusion; BRAO branch retinal artery occlusion; RT retinal tear; SB scleral buckle; PPV pars plana vitrectomy; VH Vitreous hemorrhage; PRP panretinal laser photocoagulation; IVK intravitreal kenalog; VMT vitreomacular traction; MH Macular hole;  NVD neovascularization of the disc; NVE neovascularization elsewhere; AREDS age related eye disease study; ARMD age related macular degeneration; POAG primary open angle glaucoma; EBMD epithelial/anterior basement membrane dystrophy; ACIOL anterior chamber intraocular lens; IOL intraocular lens; PCIOL posterior chamber intraocular lens; Phaco/IOL phacoemulsification with intraocular lens placement; [place] photorefractive keratectomy; LASIK laser assisted in situ keratomileusis; HTN hypertension; DM diabetes mellitus; COPD chronic obstructive pulmonary disease

## 2022-05-31 ENCOUNTER — Encounter (INDEPENDENT_AMBULATORY_CARE_PROVIDER_SITE_OTHER): Payer: BC Managed Care – PPO | Admitting: Ophthalmology

## 2022-05-31 ENCOUNTER — Ambulatory Visit (INDEPENDENT_AMBULATORY_CARE_PROVIDER_SITE_OTHER): Payer: BC Managed Care – PPO | Admitting: Ophthalmology

## 2022-05-31 ENCOUNTER — Encounter (INDEPENDENT_AMBULATORY_CARE_PROVIDER_SITE_OTHER): Payer: Self-pay | Admitting: Ophthalmology

## 2022-05-31 DIAGNOSIS — E113213 Type 2 diabetes mellitus with mild nonproliferative diabetic retinopathy with macular edema, bilateral: Secondary | ICD-10-CM

## 2022-05-31 DIAGNOSIS — H25813 Combined forms of age-related cataract, bilateral: Secondary | ICD-10-CM

## 2022-05-31 DIAGNOSIS — H35033 Hypertensive retinopathy, bilateral: Secondary | ICD-10-CM | POA: Diagnosis not present

## 2022-05-31 DIAGNOSIS — E113313 Type 2 diabetes mellitus with moderate nonproliferative diabetic retinopathy with macular edema, bilateral: Secondary | ICD-10-CM | POA: Diagnosis not present

## 2022-05-31 DIAGNOSIS — I10 Essential (primary) hypertension: Secondary | ICD-10-CM

## 2022-06-01 ENCOUNTER — Encounter (INDEPENDENT_AMBULATORY_CARE_PROVIDER_SITE_OTHER): Payer: Self-pay | Admitting: Ophthalmology

## 2022-08-02 ENCOUNTER — Encounter (INDEPENDENT_AMBULATORY_CARE_PROVIDER_SITE_OTHER): Payer: BC Managed Care – PPO | Admitting: Ophthalmology

## 2022-08-22 NOTE — Progress Notes (Shared)
[facility] Note  09/05/2022    CHIEF COMPLAINT Patient presents for No chief complaint on file.    HISTORY OF PRESENT ILLNESS: Caroline Walls is a 61 y.o. female who presents to the clinic today for:    Pt states vision "is not getting better", her A1c was 6.3 on 08.09.23, she takes Ozempic once a week and is watching what she eats  Referring physician: Gladstone Lighter, [place],  [place] [id number]  HISTORICAL INFORMATION:  Selected notes from the [facility] Referred by Dr. Kathlen Mody for eval of DME OD LEE: 10.18.22 Ocular Hx-NPDR OD, Cataracts PMH-DM    CURRENT MEDICATIONS: No current outpatient medications on file. (Ophthalmic Drugs)   No current facility-administered medications for this visit. (Ophthalmic Drugs)   Current Outpatient Medications (Other)  Medication Sig   dapagliflozin propanediol (FARXIGA) 5 MG TABS tablet Take 5 mg by mouth daily.   furosemide (LASIX) 40 MG tablet Take 40 mg by mouth daily. (Patient not taking: Reported on 12/07/2021)   glipiZIDE (GLUCOTROL XL) 2.5 MG 24 hr tablet Take 2.5 mg by mouth daily. Pt takes when needed (Patient not taking: Reported on 05/31/2022)   hydrALAZINE (APRESOLINE) 100 MG tablet Take 1 tablet (100 mg total) by mouth every 8 (eight) hours.   hydrALAZINE (APRESOLINE) 50 MG tablet Take 50 mg by mouth 4 (four) times daily.   levocetirizine (XYZAL) 5 MG tablet Take 1 tablet (5 mg total) by mouth every evening.   metoprolol succinate (TOPROL-XL) 100 MG 24 hr tablet Take 1.5 tablets (150 mg total) by mouth daily. (Patient taking differently: Take 50 mg by mouth daily. Only taking 50mg  QD)   Oxcarbazepine (TRILEPTAL) 300 MG tablet Take 300 mg by mouth 2 (two) times daily. (Patient not taking: Reported on 12/07/2021)   PHOSPHATIDYLSERINE PO Take 500 mg by mouth daily. (Patient not taking: Reported on 12/07/2021)   potassium chloride SA (KLOR-CON) 20 MEQ tablet Take 1 tablet  (20 mEq total) by mouth daily.   Prasterone (DEHYDROEPIANDROSTERONE) POWD Take 5 mg by mouth daily. (Patient not taking: Reported on 05/31/2022)   progesterone (PROMETRIUM) 100 MG capsule Take 100 mg by mouth daily. (Patient not taking: Reported on 12/07/2021)   sacubitril-valsartan (ENTRESTO) 24-26 MG Take 1 tablet by mouth 2 (two) times daily.   Semaglutide,0.25 or 0.5MG /DOS, (OZEMPIC, 0.25 OR 0.5 MG/DOSE,) 2 MG/1.5ML SOPN Inject into the skin. Weekly   spironolactone (ALDACTONE) 12.5 mg TABS tablet Take 12.5 mg by mouth daily.   No current facility-administered medications for this visit. (Other)   REVIEW OF SYSTEMS:    ALLERGIES Allergies  Allergen Reactions   Penicillins Other (See Comments)    Patient get's a yeast infection every time she takes medication. Other reaction(s): Other (See Comments) Patient get's a yeast infection every time she takes medication.    PAST MEDICAL HISTORY Past Medical History:  Diagnosis Date   CHF (congestive heart failure) ([place])    Chronic kidney disease    Diabetes mellitus without complication ([place])    Hypertension    No past surgical history on file.  FAMILY HISTORY Family History  Problem Relation Age of Onset   Heart failure Brother    Heart failure Other    SOCIAL HISTORY Social History   Tobacco Use   Smoking status: Former   Smokeless tobacco: Never  Substance Use Topics   Alcohol use: Not Currently   Drug use: Not Currently       OPHTHALMIC  EXAM: Not recorded    IMAGING AND PROCEDURES  Imaging and Procedures for 09/05/2022          ASSESSMENT/PLAN:    ICD-10-CM   1. Moderate nonproliferative diabetic retinopathy of both eyes with macular edema associated with type 2 diabetes mellitus ([place])  K74.2595     2. Essential hypertension  I10     3. Hypertensive retinopathy of both eyes  H35.033     4. 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 Both eyes affected by mild  nonproliferative diabetic retinopathy with macular edema, associated with type 2 diabetes mellitus ([place])  G38.7564        1,2. Moderate non-proliferative diabetic retinopathy, both eyes  - delayed f/u from 4 months to 6 months  - last A1c was 6.3 on 08.09.23 - exam shows mild MA OU -- mostly in macula, no NV OU - OCT shows OD: Focal cystic changes and IRHM temporal fovea -- persistent; OS: Trace cystic changes and IRHM ST fovea -- slightly improved - FA (02.22.23) shows OD: Trace MA SN macula, mild focal staining / leakage IT to fovea; OS: Rare MA, focal MA with mild leakage superior to fovea - no retinal intervention recommended at this time -- but discussed anti-VEGF therapy for diabetic macular edema - The goal hemoglobin A1C of 6-7 was discussed, as well as importance of smoking cessation and hypertension control.  Need for ongoing treatment and monitoring were specifically discussed with reference to chronic nature of diabetic macular edema. - f/u in 3 months -- DFE/OCT, possible injection  3,4. Hypertensive retinopathy OU - discussed importance of tight BP controls - monitor  5. Mixed Cataract OU - The symptoms of cataract, surgical options, and treatments and risks were discussed with patient. - discussed diagnosis and progression - under the expert management of Dr. Lucianne Lei - surgery scheduled for October 2023 - clear from a retina standpoint to proceed with cataract surgery when pt and surgeon are ready   Ophthalmic Meds Ordered this visit:  No orders of the defined types were placed in this encounter.     No follow-ups on file.  There are no Patient Instructions on file for this visit.  This document serves as a record of services personally performed by Gardiner Sleeper, MD, PhD. It was created on their behalf by Renaldo Reel, [place] an ophthalmic technician. The creation of this record is the provider's dictation and/or activities during the visit.    Electronically  signed by:  Renaldo Reel, COT  11.07.23  8:58 AM   Gardiner Sleeper, M.D., Ph.D. Diseases & Surgery of the Retina and Vitreous [facility]: M myopia (nearsighted); A astigmatism; H hyperopia (farsighted); P presbyopia; Mrx spectacle prescription;  CTL contact lenses; OD right eye; OS left eye; OU both eyes  XT exotropia; ET esotropia; PEK punctate epithelial keratitis; PEE punctate epithelial erosions; DES dry eye syndrome; MGD meibomian gland dysfunction; ATs artificial tears; PFAT's preservative free artificial tears; [place] nuclear sclerotic cataract; PSC posterior subcapsular cataract; ERM epi-retinal membrane; PVD posterior vitreous detachment; RD retinal detachment; DM diabetes mellitus; DR diabetic retinopathy; NPDR non-proliferative diabetic retinopathy; PDR proliferative diabetic retinopathy; CSME clinically significant macular edema; DME diabetic macular edema; dbh dot blot hemorrhages; CWS cotton wool spot; POAG primary open angle glaucoma; C/D cup-to-disc ratio; HVF humphrey visual field; GVF goldmann visual field; OCT optical coherence tomography; IOP intraocular pressure; BRVO Branch retinal vein occlusion;  CRVO central retinal vein occlusion; CRAO central retinal artery occlusion; BRAO branch retinal artery occlusion; RT retinal tear; SB scleral buckle; PPV pars plana vitrectomy; VH Vitreous hemorrhage; PRP panretinal laser photocoagulation; IVK intravitreal kenalog; VMT vitreomacular traction; MH Macular hole;  NVD neovascularization of the disc; NVE neovascularization elsewhere; AREDS age related eye disease study; ARMD age related macular degeneration; POAG primary open angle glaucoma; EBMD epithelial/anterior basement membrane dystrophy; ACIOL anterior chamber intraocular lens; IOL intraocular lens; PCIOL posterior chamber intraocular lens; Phaco/IOL phacoemulsification with intraocular lens placement; [place] photorefractive keratectomy; LASIK  laser assisted in situ keratomileusis; HTN hypertension; DM diabetes mellitus; COPD chronic obstructive pulmonary disease

## 2022-08-28 ENCOUNTER — Encounter: Payer: Self-pay | Admitting: Orthopaedic Surgery

## 2022-08-28 ENCOUNTER — Ambulatory Visit (INDEPENDENT_AMBULATORY_CARE_PROVIDER_SITE_OTHER): Payer: BC Managed Care – PPO | Admitting: Orthopaedic Surgery

## 2022-08-28 ENCOUNTER — Ambulatory Visit (INDEPENDENT_AMBULATORY_CARE_PROVIDER_SITE_OTHER): Payer: BC Managed Care – PPO

## 2022-08-28 DIAGNOSIS — M25561 Pain in right knee: Secondary | ICD-10-CM | POA: Diagnosis not present

## 2022-08-28 DIAGNOSIS — G8929 Other chronic pain: Secondary | ICD-10-CM | POA: Diagnosis not present

## 2022-08-28 NOTE — Progress Notes (Signed)
The patient is a 61 year old female that I am seeing for the first time.  She is here to evaluate and treat severe and surgical size of her right knee.  This has been well-documented and she was actually scheduled in [place] a year ago for a right total knee arthroplasty but her hemoglobin A1c was running too high.  That was canceled.  She eventually saw someone in a grocery store that I had performed knee replacement surgery on and she has come to me to consider knee replacement surgery on the right knee.  Her medical status is now under excellent control.  Hemoglobin A1c more recently just 3 months ago was 6.3.  She is worked on blood pressure control as well.  Her right knee pain is daily and it is 10 out of 10.  It is detrimentally affecting her mobility, her quality of life and her actives daily living.  She has had numerous injections in her right knee and that has not helped at this point.  She is been to therapy and strengthening exercises of the right knee.  There is currently this and no fever, chills, nausea, vomiting.  I reviewed all of the records within epic.  On exam her right knee shows significant medial joint line tenderness.  There is varus malalignment that is correctable.  There is significant patellofemoral crepitation as well throughout the arc of motion of the right knee.  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  There is complete loss of the medial and patellofemoral joints with bone-on-bone wear.  There are cystic changes and large osteophytes around the joint.  I had a long thorough discussion about knee replacement surgery with the patient.  I discussed the risks and benefits of the surgery.  We talked about what to expect from an intraoperative and postoperative course.  I went over her x-rays with her in a knee replacement model.  All questions and concerns were answered and addressed.  We will work on getting this scheduled for her.

## 2022-08-29 ENCOUNTER — Ambulatory Visit: Payer: Self-pay | Admitting: Orthopaedic Surgery

## 2022-09-05 ENCOUNTER — Encounter (INDEPENDENT_AMBULATORY_CARE_PROVIDER_SITE_OTHER): Payer: BC Managed Care – PPO | Admitting: Ophthalmology

## 2022-09-05 DIAGNOSIS — E113313 Type 2 diabetes mellitus with moderate nonproliferative diabetic retinopathy with macular edema, bilateral: Secondary | ICD-10-CM

## 2022-09-05 DIAGNOSIS — H35033 Hypertensive retinopathy, bilateral: Secondary | ICD-10-CM

## 2022-09-05 DIAGNOSIS — H25813 Combined forms of age-related cataract, bilateral: Secondary | ICD-10-CM

## 2022-09-05 DIAGNOSIS — I10 Essential (primary) hypertension: Secondary | ICD-10-CM

## 2022-09-05 DIAGNOSIS — E113213 Type 2 diabetes mellitus with mild nonproliferative diabetic retinopathy with macular edema, bilateral: Secondary | ICD-10-CM

## 2022-11-14 IMAGING — MR MR ABDOMEN W/O CM
11 series · 48 of 48 positions shown · non-contrast
Comparison: No prior abdominal MRI. Chest CT 11/03/2020. CT the
abdomen and pelvis 08/18/2020.

CLINICAL DATA: 60-year-old female with history of shortness of
breath. Complex lesion in the upper pole of the right kidney and
left adrenal lesion noted on recent chest CT. Follow-up study.

EXAM:
MRI ABDOMEN WITHOUT CONTRAST
TECHNIQUE: Multiplanar multisequence MR imaging was performed without the
administration of intravenous contrast.

[Series 3: cor haste · coronal · 6.0mm · 1.19mm/px · 3 of 30 slices shown]
[im 1/30]
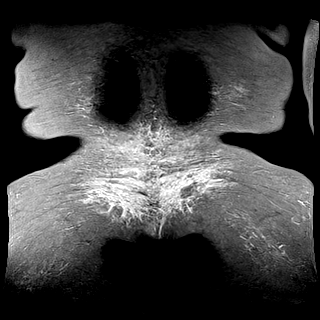
[im 15/30]
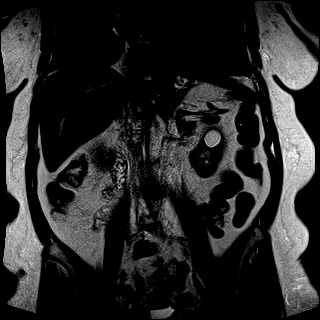
[im 30/30]
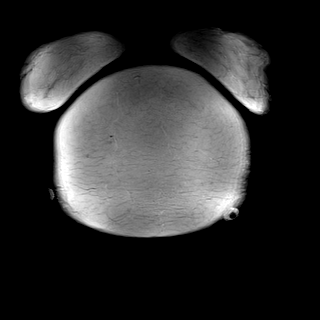

[Series 4: ax haste · axial · 6.0mm · 1.19mm/px · z∈[-81,+128]mm · 3 of 30 slices shown]
[im 1/30]
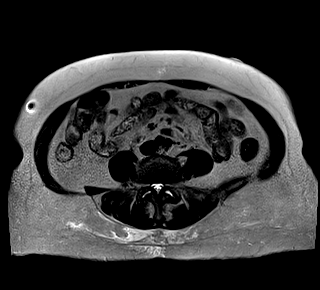
[im 15/30]
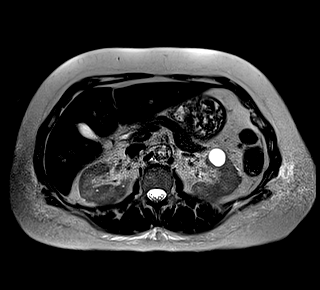
[im 30/30]
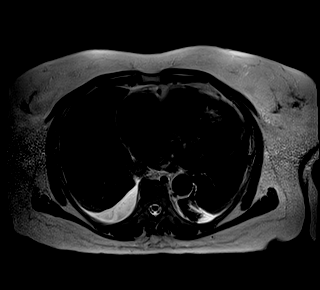

[Series 6: T2 fat-sat · axial · 6.0mm · 1.19mm/px · z∈[-76,+132]mm · 3 of 30 slices shown]
[im 1/30]
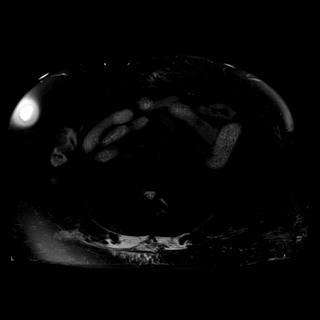
[im 15/30]
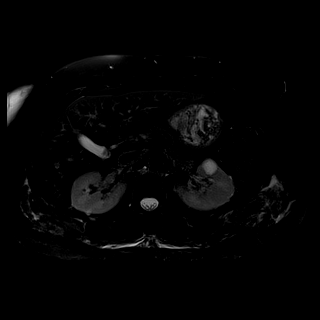
[im 30/30]
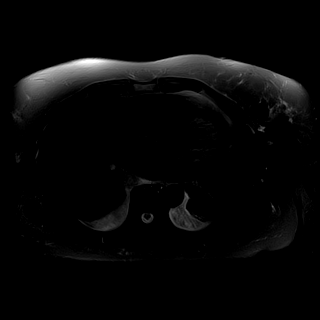

[Series 8: DWI · axial · 6.0mm · 1.42mm/px · z∈[-76,+132]mm · 7 of 90 slices shown (1 of 2)]
[im 1/90]
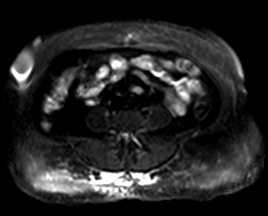
[im 15/90]
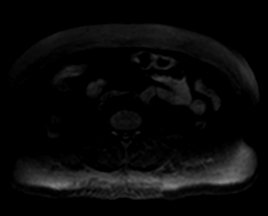
[im 30/90]
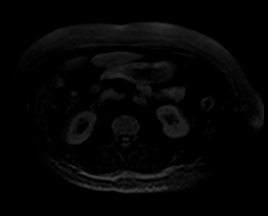
[im 45/90]
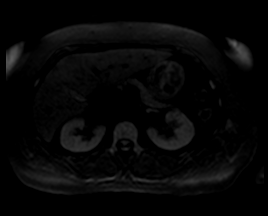
[im 60/90]
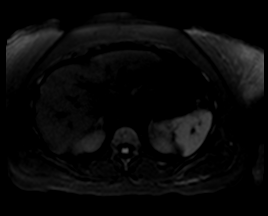
[im 75/90]
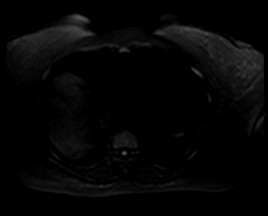
[im 90/90]
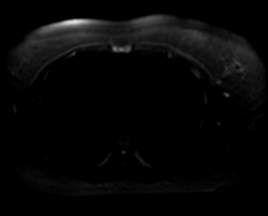

[Series 9: DWI · axial · 6.0mm · 1.42mm/px · z∈[-76,+132]mm · 2 of 30 slices shown (2 of 2)]
[im 1/30]
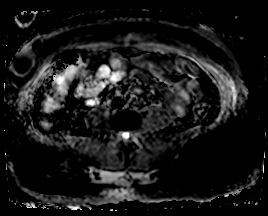
[im 30/30]
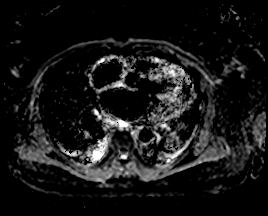

[Series 10: axial in-opp · axial · 3.0mm · 1.19mm/px · z∈[-83,+130]mm · 5 of 72 slices shown (1 of 2)]
[im 1/72]
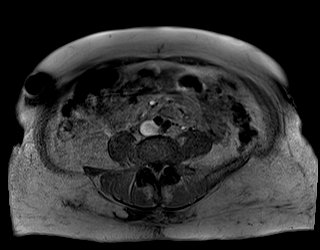
[im 18/72]
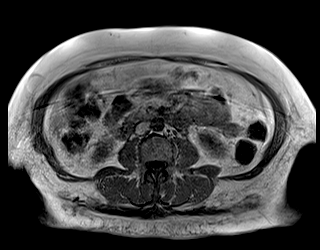
[im 36/72]
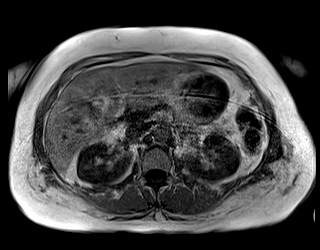
[im 54/72]
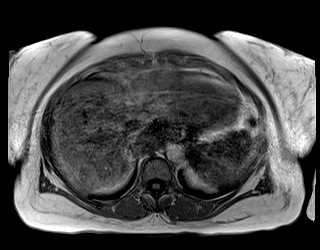
[im 72/72]
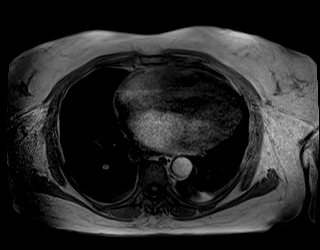

[Series 10: axial in-opp · axial · 3.0mm · 1.19mm/px · z∈[-83,+130]mm · 5 of 72 slices shown (2 of 2)]
[im 1/72]
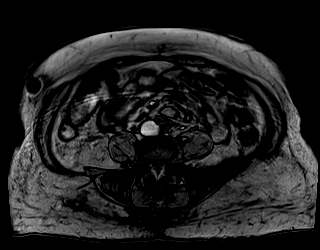
[im 18/72]
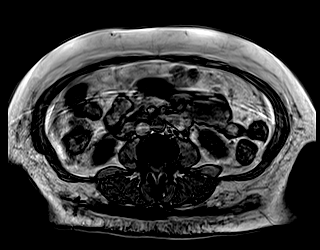
[im 36/72]
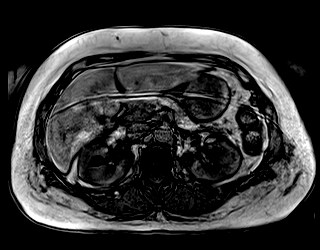
[im 54/72]
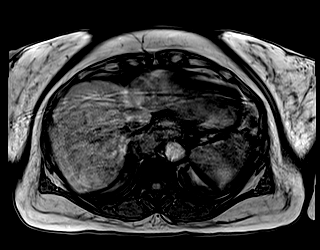
[im 72/72]
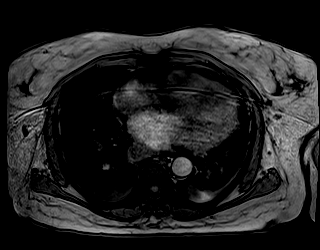

[Series 11: cor in-opp · coronal · 3.0mm · 1.19mm/px · 5 of 72 slices shown (1 of 2)]
[im 1/72]
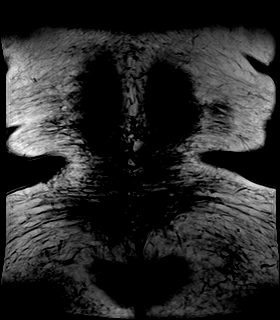
[im 18/72]
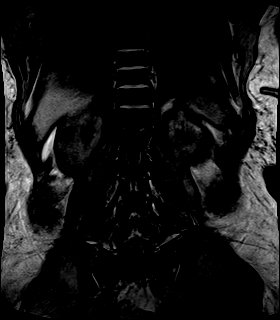
[im 36/72]
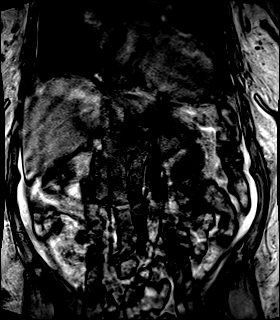
[im 54/72]
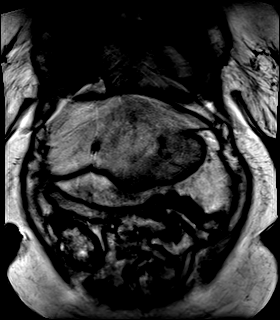
[im 72/72]
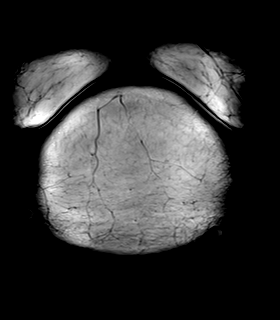

[Series 11: cor in-opp · coronal · 3.0mm · 1.19mm/px · 5 of 72 slices shown (2 of 2)]
[im 1/72]
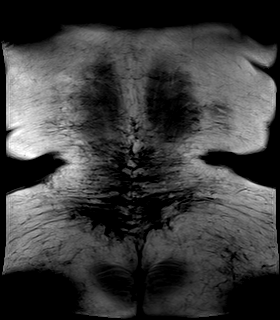
[im 18/72]
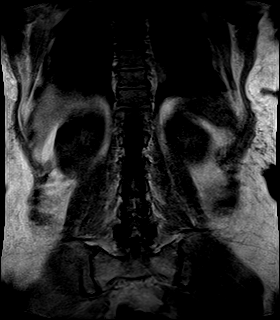
[im 36/72]
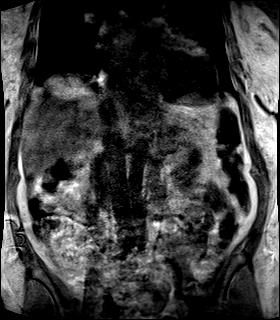
[im 54/72]
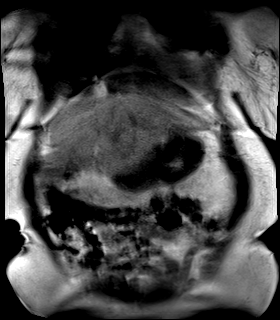
[im 72/72]
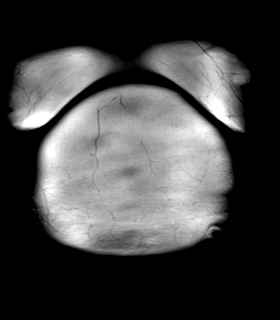

[Series 12: t1_vibe_fs_tra_p4_bh_pre · axial · 3.0mm · 1.19mm/px · z∈[-83,+130]mm · 5 of 72 slices shown]
[im 1/72]
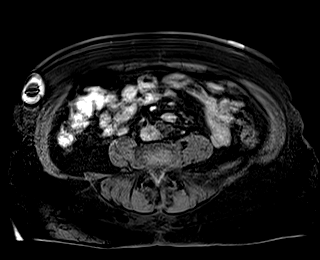
[im 18/72]
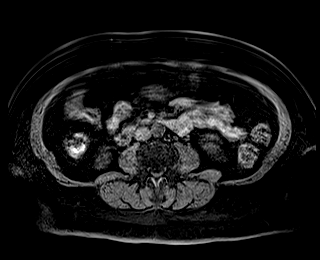
[im 36/72]
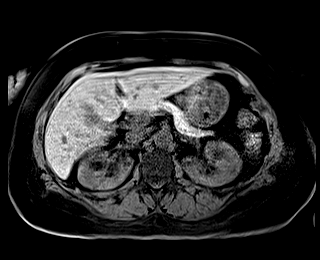
[im 54/72]
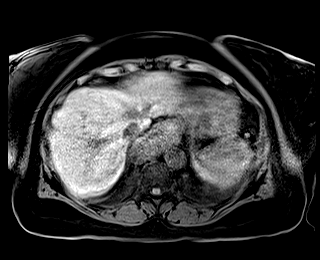
[im 72/72]
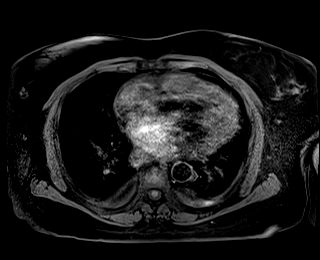

[Series 13: T1 dynamic · coronal · 3.0mm · 1.31mm/px · 5 of 72 slices shown]
[im 1/72]
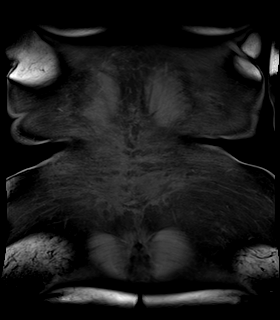
[im 18/72]
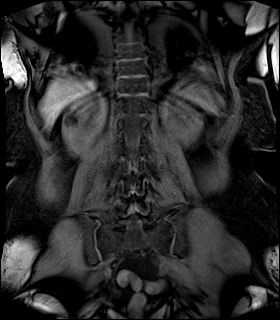
[im 36/72]
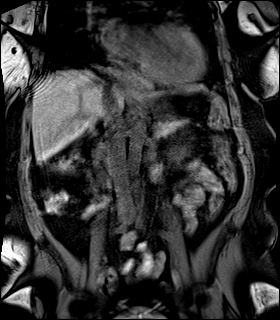
[im 54/72]
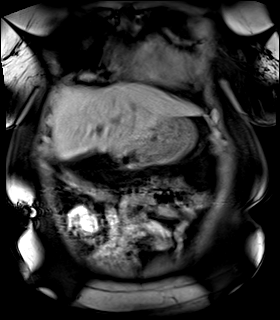
[im 72/72]
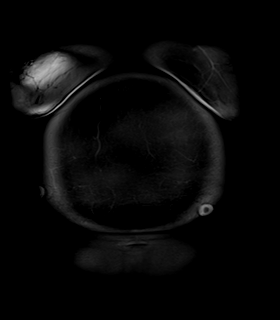

[48 of 48 positions shown; findings below may reference images not displayed]

FINDINGS: Comment: Today's study is limited for detection and characterization
of visceral and/or vascular lesions by lack of IV gadolinium. In
addition, several of the pulse sequences are limited by patient
respiratory motion.

Lower chest: Cardiomegaly. Small amount of T2 signal intensity lying
dependently in the thorax bilaterally, indicative of small bilateral
pleural effusions. Large hiatal hernia.

Hepatobiliary: No discrete cystic or solid hepatic lesions are
confidently identified on today's noncontrast examination. No intra
or extrahepatic biliary ductal dilatation. Gallbladder is
unremarkable in appearance.

Pancreas: No definite pancreatic mass or peripancreatic fluid
collections or inflammatory changes noted on today's noncontrast
examination. No pancreatic ductal dilatation.

Spleen:  Unremarkable.

Adrenals/Urinary Tract: Unfortunately, assessment of renal lesions
is severely compromised by patient respiratory motion, and lack of
IV gadolinium. With these limitations in mind, there are multiple T1
hypointense and T2 hyperintense lesions which appear to represent
cysts, largest of which is in the anterior aspect of the interpolar
region of the left kidney measuring 2.0 cm in diameter. The lesion
of concern in the upper pole of the right kidney appears to be
simple measuring 1.9 cm in diameter. No hydroureteronephrosis in the
visualized portions of the abdomen. Mild bilateral adrenal form
thickening without discrete adrenal mass.

Stomach/Bowel: Visualized portions are unremarkable.

Vascular/Lymphatic: No aneurysm identified in the visualized
abdominal vasculature. No lymphadenopathy noted in the abdomen.

Other: No significant volume of ascites in the visualized portions
of the peritoneal cavity.

Musculoskeletal: No aggressive appearing osseous lesions are noted
in the visualized portions of the skeleton.
IMPRESSION: 1. Limited examination demonstrating what appear to be multiple
simple cysts in the kidneys bilaterally. These are technically
incompletely characterized on today's noncontrast study. Further
evaluation with abdominal MRI with and without IV gadolinium could
be obtained to provide definitive characterization in the future if
clinically appropriate.
2. No adrenal mass identified.
3. Cardiomegaly.
4. Small bilateral pleural effusions.
5. Large hiatal hernia.

## 2022-11-14 IMAGING — CT CT CHEST W/O CM
2 of 3 series · 15 of 36 positions shown, 18 images · non-contrast
Comparison: None.

CLINICAL DATA: Shortness of breath.  Pleural effusions.

EXAM:
CT CHEST WITHOUT CONTRAST
TECHNIQUE: Multidetector CT imaging of the chest was performed following the
standard protocol without IV contrast.

[Series 2: thorax · axial · 0.60mm/px · z∈[-263,-49]mm · 12 of 127 slices shown, 15 images]
[im 10/127  mediastinal]
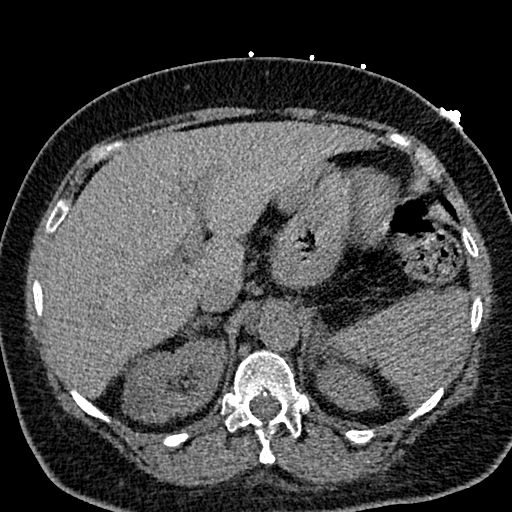
[im 10/127  lung]
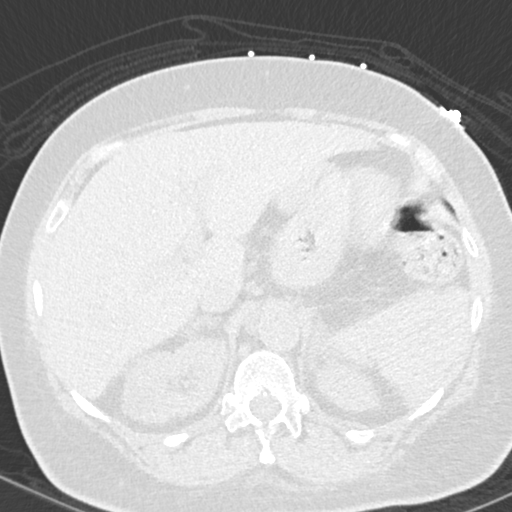
[im 19/127  lung]
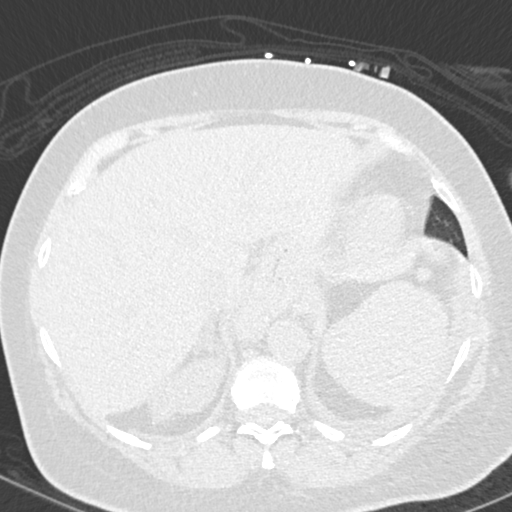
[im 29/127  lung]
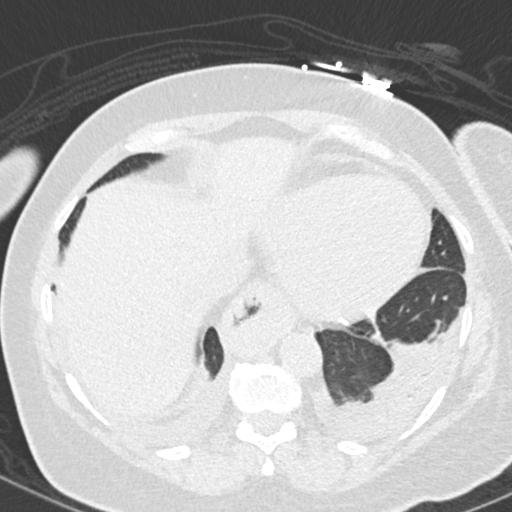
[im 38/127  lung]
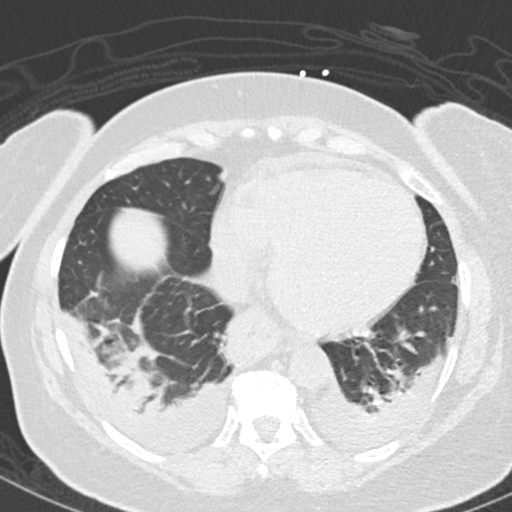
[im 47/127  mediastinal]
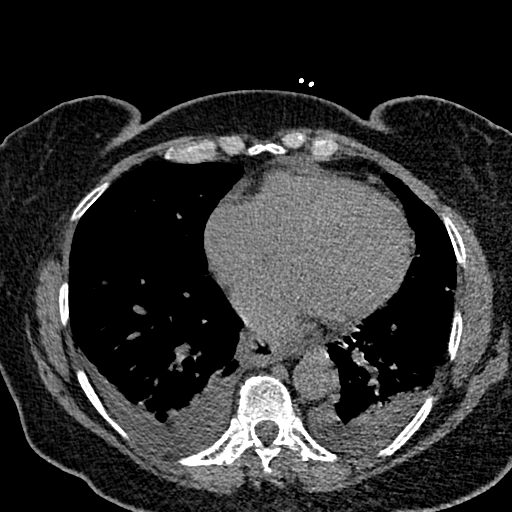
[im 47/127  lung]
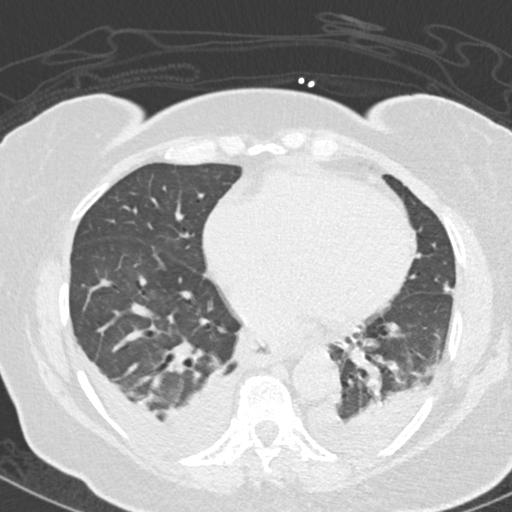
[im 57/127  lung]
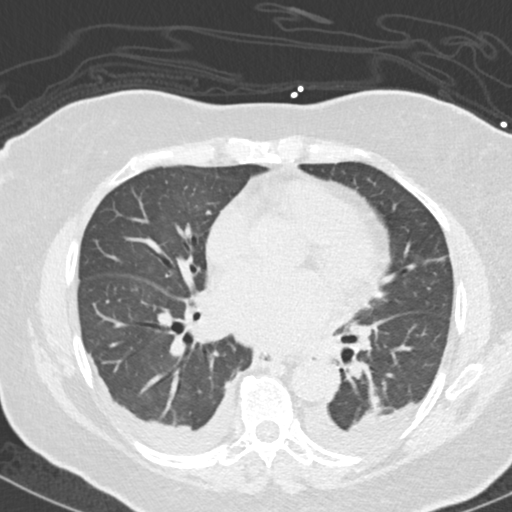
[im 71/127  lung]
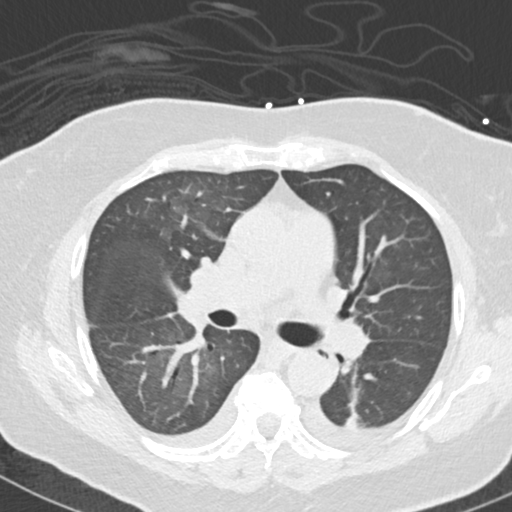
[im 80/127  lung]
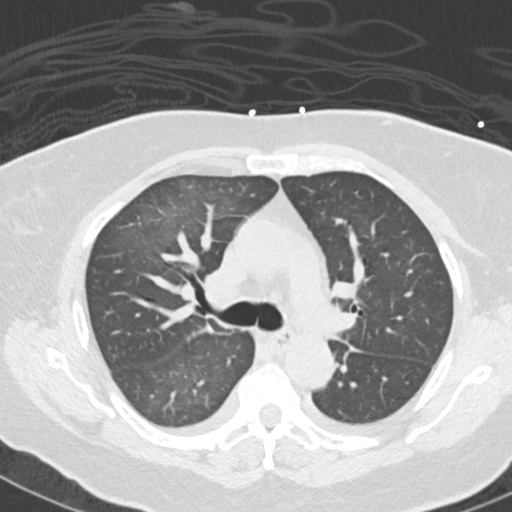
[im 89/127  mediastinal]
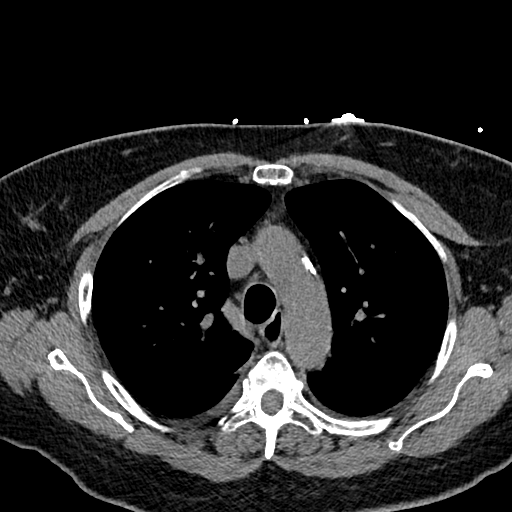
[im 89/127  lung]
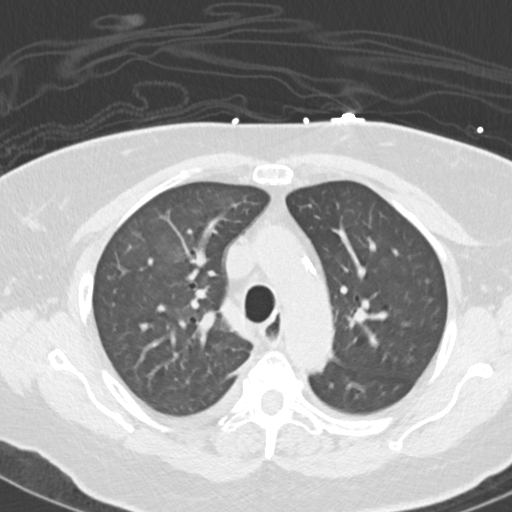
[im 99/127  lung]
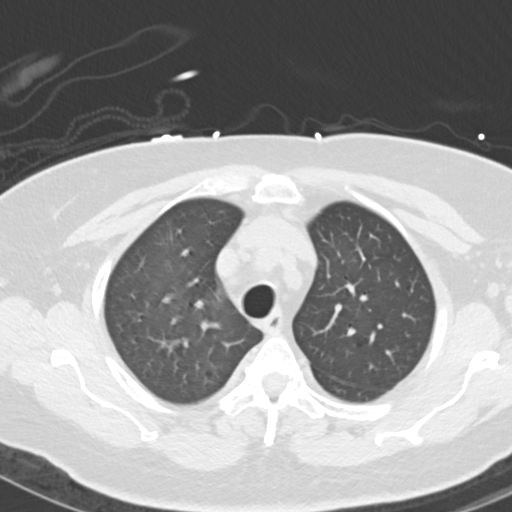
[im 108/127  lung]
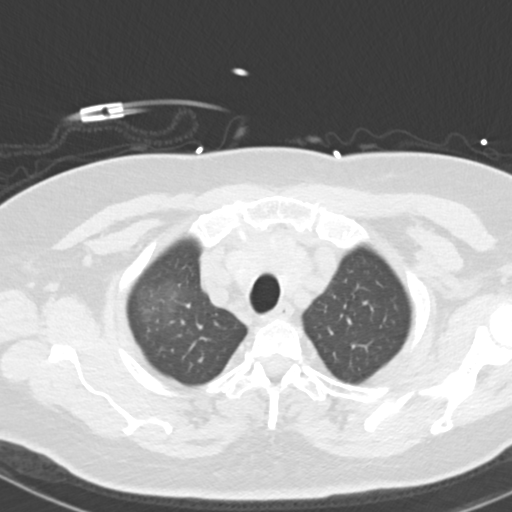
[im 117/127  lung]
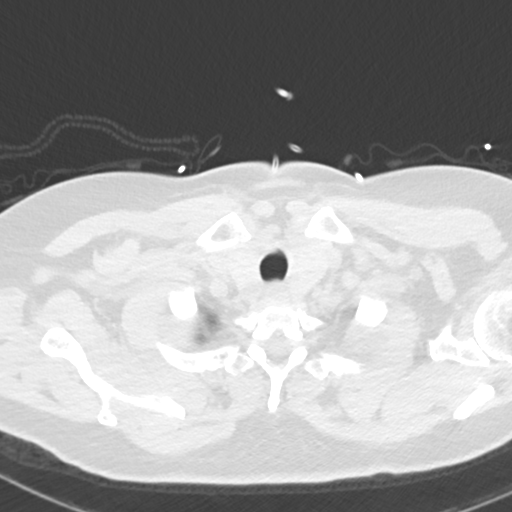

[Series 5: coronal · coronal · 0.53mm/px · 3 of 144 slices shown]
[im 29/144  lung]
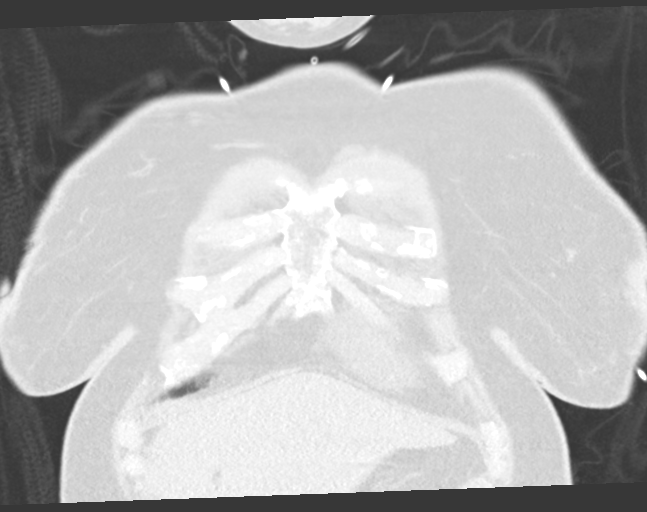
[im 58/144  lung]
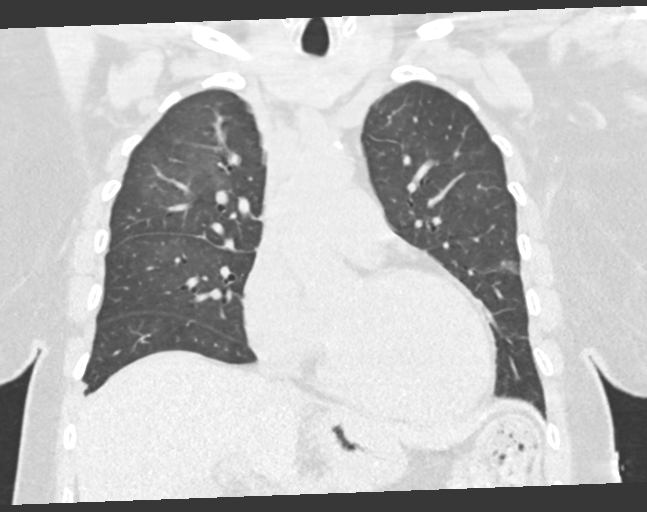
[im 86/144  lung]
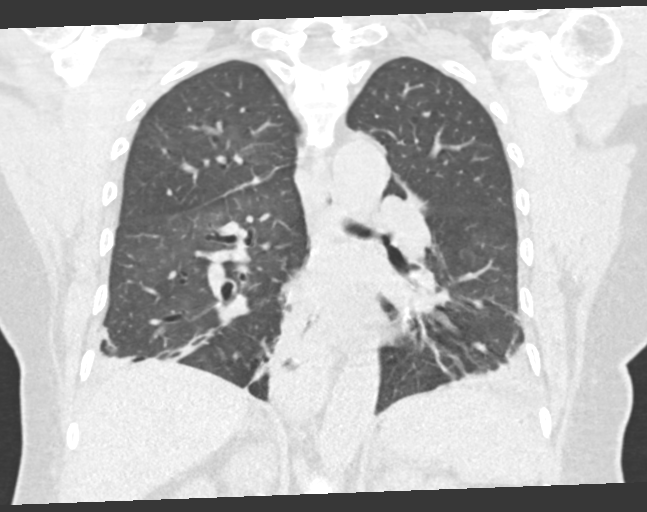

[15 of 36 positions shown; findings below may reference images not displayed]

FINDINGS: Cardiovascular: No acute findings. Aortic atherosclerotic
calcification noted.

Mediastinum/Nodes: Diffusely enlarged and heterogeneous appearance
of the thyroid gland is demonstrated. This likely represents a
multinodular goiter, although thyroid nodules are difficult to
evaluate on this unenhanced exam. No other masses or pathologically
enlarged lymph nodes identified on this unenhanced exam.

Lungs/Pleura: Small bilateral pleural effusions are seen with
associated compressive atelectasis in both lung bases. Asymmetric
diffuse ground-glass opacity is seen involving the right lung
greater than left. This may be due to asymmetric edema or atypical
infectious or inflammatory process. No suspicious pulmonary nodules
or masses are identified. No evidence of central endobronchial
obstruction.

Upper Abdomen: Moderate hiatal hernia is seen. A complex lesion is
seen in the lateral upper pole of the right kidney which measures
3.3 cm, but cannot be characterized on this unenhanced exam. A
probable small left adrenal mass is seen measuring 1.3 cm, also
nonspecific.

Musculoskeletal:  No suspicious bone lesions.
IMPRESSION: Asymmetric diffuse ground-glass opacity in right lung greater than
left, which may be due to asymmetric edema, or atypical infectious
or inflammatory process. No evidence of pulmonary mass or central
endobronchial obstruction.

Small bilateral pleural effusions with bibasilar atelectasis.

Moderate hiatal hernia.

Diffusely enlarged and heterogeneous appearance of thyroid gland.
Recommend thyroid ultrasound. (Ref: [HOSPITAL]. [DATE]): 143-50).

Indeterminate right renal lesion and probable small left adrenal
mass. Abdomen MRI without and with contrast is recommended for
further characterization.

Aortic Atherosclerosis ([id number]).

## 2023-03-02 ENCOUNTER — Other Ambulatory Visit: Payer: Self-pay | Admitting: Internal Medicine

## 2023-03-02 DIAGNOSIS — Z1231 Encounter for screening mammogram for malignant neoplasm of breast: Secondary | ICD-10-CM

## 2023-05-26 ENCOUNTER — Ambulatory Visit
Admission: EM | Admit: 2023-05-26 | Discharge: 2023-05-26 | Disposition: A | Discharge status: Home / Self Care | Payer: BC Managed Care – PPO | Attending: Emergency Medicine | Admitting: Emergency Medicine

## 2023-05-26 DIAGNOSIS — H9203 Otalgia, bilateral: Secondary | ICD-10-CM

## 2023-05-26 DIAGNOSIS — J0100 Acute maxillary sinusitis, unspecified: Secondary | ICD-10-CM

## 2023-05-26 MED ORDER — SULFAMETHOXAZOLE-TRIMETHOPRIM 800-160 MG PO TABS: 1.0000 | ORAL_TABLET | Freq: Two times a day (BID) | ORAL | 0 refills | Status: AC

## 2023-05-26 NOTE — Discharge Instructions (Addendum)
Take the antibiotic as directed.  Follow up with your primary care provider if your symptoms are not improving.     

## 2023-05-26 NOTE — ED Provider Notes (Signed)
Renaldo Fiddler    CSN: [id number] Arrival date & time: 05/26/23  1510      History   Chief Complaint Chief Complaint  Patient presents with   Otalgia    HPI Caroline Walls is a 62 y.o. female.  Patient presents with bilateral ear pain, postnasal drip, congestion, sinus pressure, fatigue, headache x 5 days.  Treatment attempted with fluticasone nasal spray and Zyrtec.  Patient also has been using expired eardrops; she does not know the name.  She denies fever, ear drainage, sore throat, cough, shortness of breath, or other symptoms.  Her medical history includes CKD 4, diabetes, hypertension, heart failure.  The history is provided by the patient and medical records.    Past Medical History:  Diagnosis Date   CHF (congestive heart failure) (HCC)    Chronic kidney disease    Diabetes mellitus without complication (HCC)    Hypertension     Patient Active Problem List   Diagnosis Date Noted   Acute on chronic systolic CHF (congestive heart failure) (HCC) 11/03/2020   Acute respiratory failure (HCC) 11/03/2020   Type 2 diabetes mellitus with stage 4 chronic kidney disease (HCC) 11/03/2020   Essential hypertension 11/03/2020    History reviewed. No pertinent surgical history.  OB History   No obstetric history on file.      Home Medications    Prior to Admission medications   Medication Sig Start Date End Date Taking? Authorizing Provider  sulfamethoxazole-trimethoprim (BACTRIM DS) 800-160 MG tablet Take 1 tablet by mouth 2 (two) times daily for 10 days. 05/26/23 06/05/23 Yes Mickie Bail, NP  dapagliflozin propanediol (FARXIGA) 5 MG TABS tablet Take 5 mg by mouth daily.    [provider]  furosemide (LASIX) 40 MG tablet Take 40 mg by mouth daily. Patient not taking: Reported on 12/07/2021 09/03/20   [provider]  glipiZIDE (GLUCOTROL XL) 2.5 MG 24 hr tablet Take 2.5 mg by mouth daily. Pt takes when needed Patient not taking: Reported on  05/31/2022 10/03/20   [provider]  hydrALAZINE (APRESOLINE) 100 MG tablet Take 1 tablet (100 mg total) by mouth every 8 (eight) hours. 11/05/20 12/05/20  Lynn Ito, MD  hydrALAZINE (APRESOLINE) 50 MG tablet Take 50 mg by mouth 4 (four) times daily.    [provider]  levocetirizine (XYZAL) 5 MG tablet Take 1 tablet (5 mg total) by mouth every evening. 12/15/20   Delma Freeze, FNP  metoprolol succinate (TOPROL-XL) 100 MG 24 hr tablet Take 1.5 tablets (150 mg total) by mouth daily. Patient taking differently: Take 50 mg by mouth daily. Only taking 50mg  QD 12/15/20   Clarisa Kindred A, FNP  Oxcarbazepine (TRILEPTAL) 300 MG tablet Take 300 mg by mouth 2 (two) times daily. Patient not taking: Reported on 12/07/2021    [provider]  PHOSPHATIDYLSERINE PO Take 500 mg by mouth daily. Patient not taking: Reported on 12/07/2021    [provider]  potassium chloride SA (KLOR-CON) 20 MEQ tablet Take 1 tablet (20 mEq total) by mouth daily. 11/06/20 12/06/20  Lynn Ito, MD  Prasterone (DEHYDROEPIANDROSTERONE) POWD Take 5 mg by mouth daily. Patient not taking: Reported on 05/31/2022    [provider]  progesterone (PROMETRIUM) 100 MG capsule Take 100 mg by mouth daily. Patient not taking: Reported on 12/07/2021    [provider]  sacubitril-valsartan (ENTRESTO) 24-26 MG Take 1 tablet by mouth 2 (two) times daily.    [provider]  Semaglutide,0.25 or 0.5MG /DOS, (  OZEMPIC, 0.25 OR 0.5 MG/DOSE,) 2 MG/1.5ML SOPN Inject into the skin. Weekly    [provider]  spironolactone (ALDACTONE) 12.5 mg TABS tablet Take 12.5 mg by mouth daily.    [provider]    Family History Family History  Problem Relation Age of Onset   Heart failure Brother    Heart failure Other     Social History Social History   Tobacco Use   Smoking status: Former   Smokeless tobacco: Never  Substance Use Topics   Alcohol use: Not Currently    Drug use: Not Currently     Allergies   Penicillins   Review of Systems Review of Systems  Constitutional:  Positive for fatigue. Negative for chills and fever.  HENT:  Positive for congestion, ear pain, postnasal drip and sinus pressure. Negative for ear discharge and sore throat.   Respiratory:  Negative for cough and shortness of breath.   Neurological:  Positive for headaches.     Physical Exam Triage Vital Signs ED Triage Vitals  Encounter Vitals Group     BP --      Systolic BP Percentile --      Diastolic BP Percentile --      Pulse Rate 05/26/23 1519 65     Resp 05/26/23 1519 18     Temp 05/26/23 1519 98.2 F (36.8 C)     Temp src --      SpO2 05/26/23 1519 98 %     Weight --      Height --      Head Circumference --      Peak Flow --      Pain Score 05/26/23 1528 9     Pain Loc --      Pain Education --      Exclude from Growth Chart --    No data found.  Updated Vital Signs BP 118/75   Pulse 65   Temp 98.2 F (36.8 C)   Resp 18   SpO2 98%   Visual Acuity Right Eye Distance:   Left Eye Distance:   Bilateral Distance:    Right Eye Near:   Left Eye Near:    Bilateral Near:     Physical Exam Vitals and nursing note reviewed.  Constitutional:      General: She is not in acute distress.    Appearance: She is well-developed.  HENT:     Right Ear: Tympanic membrane and ear canal normal.     Left Ear: Tympanic membrane and ear canal normal.     Nose: Congestion present.     Mouth/Throat:     Mouth: Mucous membranes are moist.     Pharynx: Oropharynx is clear.  Cardiovascular:     Rate and Rhythm: Normal rate and regular rhythm.     Heart sounds: Normal heart sounds.  Pulmonary:     Effort: Pulmonary effort is normal. No respiratory distress.     Breath sounds: Normal breath sounds.  Musculoskeletal:     Cervical back: Neck supple.  Skin:    General: Skin is warm and dry.  Neurological:     Mental Status: She is alert.      UC  Treatments / Results  Labs (all labs ordered are listed, but only abnormal results are displayed) Labs Reviewed - No data to display  EKG   Radiology No results found.  Procedures Procedures (including critical care time)  Medications Ordered in UC Medications - No data to display  Initial Impression / Assessment and Plan / UC Course  I have reviewed the triage vital signs and the nursing notes.  Pertinent labs & imaging results that were available during my care of the patient were reviewed by me and considered in my medical decision making (see chart for details).    Acute sinusitis, bilateral otalgia.  Afebrile and vital signs are stable.  Patient has been symptomatic for 5 days and is not improving with OTC treatment.  Treating with Bactrim.  Tylenol or ibuprofen as needed.  Instructed patient to follow up with her PCP if her symptoms are not improving.  She agrees to plan of care.   Final Clinical Impressions(s) / UC Diagnoses   Final diagnoses:  Acute non-recurrent maxillary sinusitis  Otalgia of both ears     Discharge Instructions      Take the antibiotic as directed.  Follow up with your primary care provider if your symptoms are not improving.        ED Prescriptions     Medication Sig Dispense Auth. Provider   sulfamethoxazole-trimethoprim (BACTRIM DS) 800-160 MG tablet Take 1 tablet by mouth 2 (two) times daily for 10 days. 20 tablet Mickie Bail, NP      PDMP not reviewed this encounter.   Mickie Bail, NP 05/26/23 [phone]

## 2023-05-26 NOTE — ED Triage Notes (Signed)
Patient to Urgent Care with complaints of bilateral ear pain that started five days ago. Denies any fevers.  Reports headache and fatigue. Using expired ear drops/ nasal spray/ zyrtec.

## 2024-01-10 NOTE — Progress Notes (Signed)
 [facility] - Clinic Note  01/22/2024    CHIEF COMPLAINT Patient presents for Retina Follow Up    HISTORY OF PRESENT ILLNESS: Caroline Walls is a 63 y.o. female who presents to the clinic today for:   HPI     Retina Follow Up   Patient presents with  Diabetic Retinopathy.  In both eyes.  This started 3 months ago.  I, the attending physician,  performed the HPI with the patient and updated documentation appropriately.        Comments   Patient here for 3 months retina follow up for NPDR OU. Patient states vision not good. Blurrs a lot. Has to squint to see sometimes. No eye pain.       Last edited by Rennis Chris, MD on 01/22/2024  5:34 PM.    Pt is delayed to follow up from 3 months to 20 months, she states her vision is stable  Referring physician: Enid Baas, MD [address] [place],  [place] [address]  HISTORICAL INFORMATION:  Selected notes from the MEDICAL RECORD NUMBER Referred by Dr. Alben Spittle for eval of DME OD LEE: 10.18.22 Ocular Hx-NPDR OD, Cataracts PMH-DM    CURRENT MEDICATIONS: No current outpatient medications on file. (Ophthalmic Drugs)   No current facility-administered medications for this visit. (Ophthalmic Drugs)   Current Outpatient Medications (Other)  Medication Sig   dapagliflozin propanediol (FARXIGA) 5 MG TABS tablet Take 5 mg by mouth daily.   glipiZIDE (GLUCOTROL XL) 2.5 MG 24 hr tablet Take 2.5 mg by mouth daily. Pt takes when needed   hydrALAZINE (APRESOLINE) 50 MG tablet Take 50 mg by mouth 4 (four) times daily.   levocetirizine (XYZAL) 5 MG tablet Take 1 tablet (5 mg total) by mouth every evening.   metoprolol succinate (TOPROL-XL) 100 MG 24 hr tablet Take 1.5 tablets (150 mg total) by mouth daily. (Patient taking differently: Take 50 mg by mouth daily. Only taking 50mg  QD)   sacubitril-valsartan (ENTRESTO) 24-26 MG Take 1 tablet by mouth 2 (two) times daily.   Semaglutide,0.25 or 0.5MG /DOS, (OZEMPIC, 0.25  OR 0.5 MG/DOSE,) 2 MG/1.5ML SOPN Inject into the skin. Weekly   spironolactone (ALDACTONE) 12.5 mg TABS tablet Take 12.5 mg by mouth daily.   furosemide (LASIX) 40 MG tablet Take 40 mg by mouth daily. (Patient not taking: Reported on 12/07/2021)   hydrALAZINE (APRESOLINE) 100 MG tablet Take 1 tablet (100 mg total) by mouth every 8 (eight) hours.   Oxcarbazepine (TRILEPTAL) 300 MG tablet Take 300 mg by mouth 2 (two) times daily. (Patient not taking: Reported on 01/22/2024)   PHOSPHATIDYLSERINE PO Take 500 mg by mouth daily. (Patient not taking: Reported on 01/22/2024)   potassium chloride SA (KLOR-CON) 20 MEQ tablet Take 1 tablet (20 mEq total) by mouth daily.   Prasterone (DEHYDROEPIANDROSTERONE) POWD Take 5 mg by mouth daily. (Patient not taking: Reported on 01/22/2024)   progesterone (PROMETRIUM) 100 MG capsule Take 100 mg by mouth daily. (Patient not taking: Reported on 01/22/2024)   No current facility-administered medications for this visit. (Other)   REVIEW OF SYSTEMS: ROS   Positive for: Endocrine, Cardiovascular, Eyes, Respiratory Negative for: Constitutional, Gastrointestinal, Neurological, Skin, Genitourinary, Musculoskeletal, HENT, Psychiatric, Allergic/Imm, Heme/Lymph Last edited by Laddie Aquas, COA on 01/22/2024  2:05 PM.       ALLERGIES Allergies  Allergen Reactions   Penicillins Other (See Comments)    Patient get's a yeast infection every time she takes medication. Other reaction(s): Other (See Comments) Patient get's  a yeast infection every time she takes medication.    PAST MEDICAL HISTORY Past Medical History:  Diagnosis Date   CHF (congestive heart failure) (HCC)    Chronic kidney disease    Diabetes mellitus without complication (HCC)    Hypertension    History reviewed. No pertinent surgical history.  FAMILY HISTORY Family History  Problem Relation Age of Onset   Heart failure Brother    Heart failure Other    SOCIAL HISTORY Social History   Tobacco  Use   Smoking status: Former   Smokeless tobacco: Never  Advertising account planner   Vaping status: Never Used  Substance Use Topics   Alcohol use: Not Currently   Drug use: Not Currently       OPHTHALMIC EXAM: Base Eye Exam     Visual Acuity (Snellen - Linear)       Right Left   Dist cc 20/40 -1 20/30 +1   Dist ph cc NI 20/20 -2    Correction: Glasses         Tonometry (Tonopen, 2:02 PM)       Right Left   Pressure 17 17         Pupils       Dark Light Shape React APD   Right 3 2 Round Brisk None   Left 3 2 Round Brisk None         Visual Fields (Counting fingers)       Left Right    Full Full         Extraocular Movement       Right Left    Full, Ortho Full, Ortho         Neuro/Psych     Oriented x3: Yes   Mood/Affect: Normal         Dilation     Both eyes: 1.0% Mydriacyl, 2.5% Phenylephrine @ 2:02 PM           Slit Lamp and Fundus Exam     External Exam       Right Left   External Normal Normal         Slit Lamp Exam       Right Left   Lids/Lashes Dermatochalasis - upper lid Dermatochalasis - upper lid   Conjunctiva/Sclera mild melanosis mild melanosis   Cornea arcus, trace PEE, trace EBMD mild arcus   Anterior Chamber deep and clear deep and clear   Iris Round and dilated, No NVI Round and dilated, No NVI   Lens 1-2+ Nuclear sclerosis with early brunescence, 2+ Cortical cataract, +Vacuoles, 1+ Posterior subcapsular cataract 2+ Nuclear sclerosis with early brunescence, 2+ Cortical cataract, +Vacuoles   Anterior Vitreous Vitreous syneresis Vitreous syneresis, Posterior vitreous detachment         Fundus Exam       Right Left   Disc Pink and Sharp Pink and Sharp   C/D Ratio 0.5 0.5   Macula Flat, Blunted foveal reflex, punctate exudates and cystic changes temporal fovea, scattered MA Flat, blunted foveal reflex, mild focal exudate ST fovea, trace punctate MA, central cystic changes   Vessels attenuated, Tortuous attenuated,  Tortuous   Periphery Attached, No heme Attached, No heme           Refraction     Wearing Rx       Sphere Cylinder Axis   Right -4.25 Sphere    Left -4.50 +1.75 094    Type: SVL  IMAGING AND PROCEDURES  Imaging and Procedures for 01/22/2024  OCT, Retina - OU - Both Eyes       Right Eye Quality was good. Central Foveal Thickness: 331. Progression has worsened. Findings include normal foveal contour, no SRF, intraretinal hyper-reflective material, intraretinal fluid (Interval increase in cystic changes and IRHM temporal fovea ).   Left Eye Quality was good. Central Foveal Thickness: 255. Progression has worsened. Findings include normal foveal contour, no IRF, no SRF, intraretinal hyper-reflective material (Interval increase in cystic changes and IRHM ST fovea ).   Notes *Images captured and stored on drive  Diagnosis / Impression:  +DME OU OD: Interval increase in cystic changes and IRHM temporal fovea  OS: Interval increase in cystic changes and IRHM ST fovea   Clinical management:  See below  Abbreviations: NFP - Normal foveal profile. CME - cystoid macular edema. PED - pigment epithelial detachment. IRF - intraretinal fluid. SRF - subretinal fluid. EZ - ellipsoid zone. ERM - epiretinal membrane. ORA - outer retinal atrophy. ORT - outer retinal tubulation. SRHM - subretinal hyper-reflective material. IRHM - intraretinal hyper-reflective material      Intravitreal Injection, Pharmacologic Agent - OD - Right Eye       Time Out 01/22/2024. 3:21 PM. Confirmed correct patient, procedure, site, and patient consented.   Anesthesia Topical anesthesia was used. Anesthetic medications included Lidocaine 2%, Proparacaine 0.5%.   Procedure Preparation included 5% betadine to ocular surface, eyelid speculum. A supplied needle was used.   Injection: 1.25 mg Bevacizumab 1.25mg /0.62ml   Route: Intravitreal, Site: Right Eye   NDC: [id number], Lot: [id number] ,  Expiration date: 02/23/2024   Post-op Post injection exam found visual acuity of at least counting fingers. The patient tolerated the procedure well. There were no complications. The patient received written and verbal post procedure care education.            ASSESSMENT/PLAN:    ICD-10-CM   1. Moderate nonproliferative diabetic retinopathy of both eyes with macular edema associated with type 2 diabetes mellitus (HCC)  E11.3313 OCT, Retina - OU - Both Eyes    Intravitreal Injection, Pharmacologic Agent - OD - Right Eye    Bevacizumab (AVASTIN) SOLN 1.25 mg    2. Essential hypertension  I10     3. Hypertensive retinopathy of both eyes  H35.033     4. 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 Both eyes affected by mild nonproliferative diabetic retinopathy with macular edema, associated with type 2 diabetes mellitus (HCC)  D22.0254      1,2. Moderate non-proliferative diabetic retinopathy, both eyes  - delayed f/u from 3 months to 20 months (08.16.23 to 04.08.25)  - delayed f/u from 4 months to 6 months  - last A1c - 6.9 on 03.14.25 - exam shows mild MA OU -- mostly in macula, no NV OU - OCT shows OD: interval increase in cystic changes and IRHM temporal fovea; OS: Interval increase in cystic changes and IRHM ST fovea  - FA (02.22.23) shows OD: Trace MA SN macula, mild focal staining / leakage IT to fovea; OS: Rare MA, focal MA with mild leakage superior to fovea - BCVA OD 20/40, OS 20/20 - recommend IVA OD #1 today, 04.08.25 for worsening DME - pt wishes to proceed with injection - RBA of procedure discussed, questions answered - IVA informed consent obtained and signed, 04.08.25 - see procedure note - f/u in 4 weeks -- DFE/OCT, possible injection  3,4. Hypertensive retinopathy  OU - discussed importance of tight BP controls - monitor  5. Mixed Cataract OU - The symptoms of cataract, surgical options, and treatments and risks were discussed with  patient. - discussed diagnosis and progression - under the expert management of Dr. Zenaida Niece - clear from a retina standpoint to proceed with cataract surgery when pt and surgeon are ready   Ophthalmic Meds Ordered this visit:  Meds ordered this encounter  Medications   Bevacizumab (AVASTIN) SOLN 1.25 mg      Return in about 4 weeks (around 02/19/2024) for f/u NPDR OU, DFE, OCT.  There are no Patient Instructions on file for this visit.  This document serves as a record of services personally performed by Karie Chimera, MD, PhD. It was created on their behalf by Charlette Caffey, COT an ophthalmic technician. The creation of this record is the provider's dictation and/or activities during the visit.    Electronically signed by:  Charlette Caffey, COT  01/22/24 5:35 PM  This document serves as a record of services personally performed by Karie Chimera, MD, PhD. It was created on their behalf by Glee Arvin. Manson Passey, OA an ophthalmic technician. The creation of this record is the provider's dictation and/or activities during the visit.    Electronically signed by: Glee Arvin. Manson Passey, OA 01/22/24 5:35 PM  Karie Chimera, M.D., Ph.D. Diseases & Surgery of the Retina and Vitreous [facility]  I have reviewed the above documentation for accuracy and completeness, and I agree with the above. Karie Chimera, M.D., Ph.D. 01/22/24 5:37 PM   Abbreviations: M myopia (nearsighted); A astigmatism; H hyperopia (farsighted); P presbyopia; Mrx spectacle prescription;  CTL contact lenses; OD right eye; OS left eye; OU both eyes  XT exotropia; ET esotropia; PEK punctate epithelial keratitis; PEE punctate epithelial erosions; DES dry eye syndrome; MGD meibomian gland dysfunction; ATs artificial tears; PFAT's preservative free artificial tears; NSC nuclear sclerotic cataract; PSC posterior subcapsular cataract; ERM epi-retinal membrane; PVD posterior vitreous detachment; RD retinal  detachment; DM diabetes mellitus; DR diabetic retinopathy; NPDR non-proliferative diabetic retinopathy; PDR proliferative diabetic retinopathy; CSME clinically significant macular edema; DME diabetic macular edema; dbh dot blot hemorrhages; CWS cotton wool spot; POAG primary open angle glaucoma; C/D cup-to-disc ratio; HVF humphrey visual field; GVF goldmann visual field; OCT optical coherence tomography; IOP intraocular pressure; BRVO Branch retinal vein occlusion; CRVO central retinal vein occlusion; CRAO central retinal artery occlusion; BRAO branch retinal artery occlusion; RT retinal tear; SB scleral buckle; PPV pars plana vitrectomy; VH Vitreous hemorrhage; PRP panretinal laser photocoagulation; IVK intravitreal kenalog; VMT vitreomacular traction; MH Macular hole;  NVD neovascularization of the disc; NVE neovascularization elsewhere; AREDS age related eye disease study; ARMD age related macular degeneration; POAG primary open angle glaucoma; EBMD epithelial/anterior basement membrane dystrophy; ACIOL anterior chamber intraocular lens; IOL intraocular lens; PCIOL posterior chamber intraocular lens; Phaco/IOL phacoemulsification with intraocular lens placement; PRK photorefractive keratectomy; LASIK laser assisted in situ keratomileusis; HTN hypertension; DM diabetes mellitus; COPD chronic obstructive pulmonary disease

## 2024-01-22 ENCOUNTER — Encounter (INDEPENDENT_AMBULATORY_CARE_PROVIDER_SITE_OTHER): Payer: Self-pay | Admitting: Ophthalmology

## 2024-01-22 ENCOUNTER — Ambulatory Visit (INDEPENDENT_AMBULATORY_CARE_PROVIDER_SITE_OTHER): Payer: Self-pay | Admitting: Ophthalmology

## 2024-01-22 DIAGNOSIS — H25813 Combined forms of age-related cataract, bilateral: Secondary | ICD-10-CM | POA: Diagnosis not present

## 2024-01-22 DIAGNOSIS — E113313 Type 2 diabetes mellitus with moderate nonproliferative diabetic retinopathy with macular edema, bilateral: Secondary | ICD-10-CM | POA: Diagnosis not present

## 2024-01-22 DIAGNOSIS — I10 Essential (primary) hypertension: Secondary | ICD-10-CM | POA: Diagnosis not present

## 2024-01-22 DIAGNOSIS — H35033 Hypertensive retinopathy, bilateral: Secondary | ICD-10-CM | POA: Diagnosis not present

## 2024-01-22 DIAGNOSIS — E113213 Type 2 diabetes mellitus with mild nonproliferative diabetic retinopathy with macular edema, bilateral: Secondary | ICD-10-CM

## 2024-01-22 MED ORDER — BEVACIZUMAB CHEMO INJECTION 1.25MG/0.05ML SYRINGE FOR KALEIDOSCOPE
1.2500 mg | INTRAVITREAL | Status: AC | PRN
  Administered 2024-01-22: 1.25 mg via INTRAVITREAL

## 2024-02-05 NOTE — Progress Notes (Signed)
 [facility] - Clinic Note  02/19/2024    CHIEF COMPLAINT Patient presents for Retina Follow Up    HISTORY OF PRESENT ILLNESS: Caroline Walls is a 63 y.o. female who presents to the clinic today for:   HPI     Retina Follow Up   Patient presents with  Diabetic Retinopathy.  In both eyes.  This started 4 weeks ago.  I, the attending physician,  performed the HPI with the patient and updated documentation appropriately.        Comments   Patient here for 4 weeks retina follow up for NPDR OU. Patient states vision about the same. No eye pain.       Last edited by Ronelle Coffee, MD on 02/19/2024  4:21 PM.     Pt states she did well s/p IVA OD #1. Can't tell much visual improvement.  Referring physician: Rex Castor, MD [address] [place],  [place] [address]  HISTORICAL INFORMATION:  Selected notes from the MEDICAL RECORD NUMBER Referred by Dr. Reyne Cave for eval of DME OD LEE: 10.18.22 Ocular Hx-NPDR OD, Cataracts PMH-DM    CURRENT MEDICATIONS: No current outpatient medications on file. (Ophthalmic Drugs)   No current facility-administered medications for this visit. (Ophthalmic Drugs)   Current Outpatient Medications (Other)  Medication Sig   dapagliflozin propanediol (FARXIGA) 5 MG TABS tablet Take 5 mg by mouth daily.   furosemide  (LASIX ) 40 MG tablet Take 40 mg by mouth daily.   glipiZIDE (GLUCOTROL XL) 2.5 MG 24 hr tablet Take 2.5 mg by mouth daily. Pt takes when needed   hydrALAZINE  (APRESOLINE ) 50 MG tablet Take 50 mg by mouth 4 (four) times daily.   levocetirizine (XYZAL ) 5 MG tablet Take 1 tablet (5 mg total) by mouth every evening.   metoprolol  succinate (TOPROL -XL) 100 MG 24 hr tablet Take 1.5 tablets (150 mg total) by mouth daily.   sacubitril-valsartan (ENTRESTO) 24-26 MG Take 1 tablet by mouth 2 (two) times daily.   Semaglutide,0.25 or 0.5MG /DOS, (OZEMPIC, 0.25 OR 0.5 MG/DOSE,) 2 MG/1.5ML SOPN Inject into the skin. Weekly    spironolactone (ALDACTONE) 12.5 mg TABS tablet Take 12.5 mg by mouth daily.   hydrALAZINE  (APRESOLINE ) 100 MG tablet Take 1 tablet (100 mg total) by mouth every 8 (eight) hours.   Oxcarbazepine (TRILEPTAL) 300 MG tablet Take 300 mg by mouth 2 (two) times daily. (Patient not taking: Reported on 12/07/2021)   PHOSPHATIDYLSERINE PO Take 500 mg by mouth daily. (Patient not taking: Reported on 12/07/2021)   potassium chloride  SA (KLOR-CON ) 20 MEQ tablet Take 1 tablet (20 mEq total) by mouth daily.   Prasterone (DEHYDROEPIANDROSTERONE) POWD Take 5 mg by mouth daily. (Patient not taking: Reported on 05/31/2022)   progesterone (PROMETRIUM) 100 MG capsule Take 100 mg by mouth daily. (Patient not taking: Reported on 12/07/2021)   No current facility-administered medications for this visit. (Other)   REVIEW OF SYSTEMS: ROS   Positive for: Endocrine, Cardiovascular, Eyes, Respiratory Negative for: Constitutional, Gastrointestinal, Neurological, Skin, Genitourinary, Musculoskeletal, HENT, Psychiatric, Allergic/Imm, Heme/Lymph Last edited by Sylvan Evener, COA on 02/19/2024  3:01 PM.     ALLERGIES Allergies  Allergen Reactions   Penicillins Other (See Comments)    Patient get's a yeast infection every time she takes medication. Other reaction(s): Other (See Comments) Patient get's a yeast infection every time she takes medication.    PAST MEDICAL HISTORY Past Medical History:  Diagnosis Date   CHF (congestive heart failure) (HCC)    Chronic kidney  disease    Diabetes mellitus without complication (HCC)    Hypertension    History reviewed. No pertinent surgical history.  FAMILY HISTORY Family History  Problem Relation Age of Onset   Heart failure Brother    Heart failure Other    SOCIAL HISTORY Social History   Tobacco Use   Smoking status: Former   Smokeless tobacco: Never  Advertising account planner   Vaping status: Never Used  Substance Use Topics   Alcohol use: Not Currently   Drug use: Not  Currently       OPHTHALMIC EXAM: Base Eye Exam     Visual Acuity (Snellen - Linear)       Right Left   Dist cc 20/40 -2 20/25 -2   Dist ph cc  20/20 -1    Correction: Glasses         Tonometry (Tonopen, 2:59 PM)       Right Left   Pressure 16 17         Pupils       Dark Light Shape React APD   Right 3 2 Round Brisk None   Left 3 2 Round Brisk None         Visual Fields (Counting fingers)       Left Right    Full Full         Extraocular Movement       Right Left    Full, Ortho Full, Ortho         Neuro/Psych     Oriented x3: Yes   Mood/Affect: Normal         Dilation     Both eyes: 1.0% Mydriacyl, 2.5% Phenylephrine @ 2:59 PM           Slit Lamp and Fundus Exam     External Exam       Right Left   External Normal Normal         Slit Lamp Exam       Right Left   Lids/Lashes Dermatochalasis - upper lid Dermatochalasis - upper lid   Conjunctiva/Sclera mild melanosis mild melanosis   Cornea arcus, trace PEE, trace EBMD mild arcus   Anterior Chamber deep and clear deep and clear   Iris Round and dilated, No NVI Round and dilated, No NVI   Lens 1-2+ Nuclear sclerosis with early brunescence, 2+ Cortical cataract, +Vacuoles, 1+ Posterior subcapsular cataract 2+ Nuclear sclerosis with early brunescence, 2+ Cortical cataract, +Vacuoles   Anterior Vitreous Vitreous syneresis, Posterior vitreous detachment Vitreous syneresis, Posterior vitreous detachment         Fundus Exam       Right Left   Disc Pink and Sharp Pink and Sharp   C/D Ratio 0.5 0.5   Macula Flat, Blunted foveal reflex, punctate exudates and cystic changes temporal fovea--slightly improved, scattered MA Flat, blunted foveal reflex, mild focal exudate ST fovea, trace punctate MA, central cystic changes--slightly improved   Vessels attenuated, Tortuous attenuated, Tortuous   Periphery Attached, No heme Attached, No heme           Refraction     Wearing Rx        Sphere Cylinder Axis   Right -4.25 Sphere    Left -4.50 +1.75 094    Type: SVL           IMAGING AND PROCEDURES  Imaging and Procedures for 02/19/2024  OCT, Retina - OU - Both Eyes       Right Eye Quality  was good. Central Foveal Thickness: 324. Progression has improved. Findings include normal foveal contour, no SRF, intraretinal hyper-reflective material, intraretinal fluid (Mild interval improvement in cystic changes and IRHM temporal fovea ).   Left Eye Quality was good. Central Foveal Thickness: 256. Progression has improved. Findings include normal foveal contour, no IRF, no SRF, intraretinal hyper-reflective material (Mild Interval improvement in cystic changes and IRHM ST fovea ).   Notes *Images captured and stored on drive  Diagnosis / Impression:  +DME OU OD: Mild interval improvement in cystic changes and IRHM temporal fovea  OS: Mild interval improvement in cystic changes and IRHM ST fovea   Clinical management:  See below  Abbreviations: NFP - Normal foveal profile. CME - cystoid macular edema. PED - pigment epithelial detachment. IRF - intraretinal fluid. SRF - subretinal fluid. EZ - ellipsoid zone. ERM - epiretinal membrane. ORA - outer retinal atrophy. ORT - outer retinal tubulation. SRHM - subretinal hyper-reflective material. IRHM - intraretinal hyper-reflective material      Intravitreal Injection, Pharmacologic Agent - OD - Right Eye       Time Out 02/19/2024. 3:18 PM. Confirmed correct patient, procedure, site, and patient consented.   Anesthesia Topical anesthesia was used. Anesthetic medications included Lidocaine 2%, Proparacaine 0.5%.   Procedure Preparation included 5% betadine to ocular surface, eyelid speculum. A supplied needle was used.   Injection: 1.25 mg Bevacizumab  1.25mg /0.38ml   Route: Intravitreal, Site: Right Eye   NDC: [id number], Lot: [id number], Expiration date: 03/13/2024   Post-op Post injection exam found visual acuity  of at least counting fingers. The patient tolerated the procedure well. There were no complications. The patient received written and verbal post procedure care education.            ASSESSMENT/PLAN:    ICD-10-CM   1. Moderate nonproliferative diabetic retinopathy of both eyes with macular edema associated with type 2 diabetes mellitus (HCC)  E11.3313 OCT, Retina - OU - Both Eyes    Intravitreal Injection, Pharmacologic Agent - OD - Right Eye    Bevacizumab  (AVASTIN ) SOLN 1.25 mg    2. Essential hypertension  I10     3. Hypertensive retinopathy of both eyes  H35.033     4. 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 Both eyes affected by mild nonproliferative diabetic retinopathy with macular edema, associated with type 2 diabetes mellitus (HCC)  Z61.0960      1,2. Moderate non-proliferative diabetic retinopathy, both eyes  - delayed f/u from 3 months to 20 months (08.16.23 to 04.08.25)  - delayed f/u from 4 months to 6 months  - last A1c - 6.9 on 03.14.25 - s/p IVA OD #1 (04.08.25) - exam shows mild MA OU -- mostly in macula, no NV OU - OCT shows OD: Mild interval improvement in cystic changes and IRHM temporal fovea, OS: Mild interval improvement in cystic changes and IRHM ST fovea at 4 wks - FA (02.22.23) shows OD: Trace MA SN macula, mild focal staining / leakage IT to fovea; OS: Rare MA, focal MA with mild leakage superior to fovea - BCVA OD 20/40, OS 20/20 -- stable OU - recommend IVA OD #2 today, 05.06.25 w/ f/u in 4 wks - pt wishes to proceed with injection - RBA of procedure discussed, questions answered - IVA informed consent obtained and signed, 04.08.25 - see procedure note - f/u in 4 weeks -- DFE/OCT, possible injection  3,4. Hypertensive retinopathy OU - discussed importance of tight BP  controls - monitor  5. Mixed Cataract OU - The symptoms of cataract, surgical options, and treatments and risks were discussed with patient. -  discussed diagnosis and progression - under the expert management of Dr. Carloyn Chi - clear from a retina standpoint to proceed with cataract surgery when pt and surgeon are ready   Ophthalmic Meds Ordered this visit:  Meds ordered this encounter  Medications   Bevacizumab  (AVASTIN ) SOLN 1.25 mg     Return in about 4 weeks (around 03/18/2024) for NPDR OU, DFE, OCT, likely IVA OD.  There are no Patient Instructions on file for this visit.  This document serves as a record of services personally performed by Jeanice Millard, MD, PhD. It was created on their behalf by Olene Berne, COT an ophthalmic technician. The creation of this record is the provider's dictation and/or activities during the visit.    Electronically signed by:  Olene Berne, COT  02/19/24 9:31 PM  This document serves as a record of services personally performed by Jeanice Millard, MD, PhD. It was created on their behalf by Angelia Kelp, an ophthalmic technician. The creation of this record is the provider's dictation and/or activities during the visit.    Electronically signed by: Angelia Kelp, OA, 02/19/24  9:31 PM  Jeanice Millard, M.D., Ph.D. Diseases & Surgery of the Retina and Vitreous [facility]  I have reviewed the above documentation for accuracy and completeness, and I agree with the above. Jeanice Millard, M.D., Ph.D. 02/19/24 9:45 PM    Abbreviations: M myopia (nearsighted); A astigmatism; H hyperopia (farsighted); P presbyopia; Mrx spectacle prescription;  CTL contact lenses; OD right eye; OS left eye; OU both eyes  XT exotropia; ET esotropia; PEK punctate epithelial keratitis; PEE punctate epithelial erosions; DES dry eye syndrome; MGD meibomian gland dysfunction; ATs artificial tears; PFAT's preservative free artificial tears; NSC nuclear sclerotic cataract; PSC posterior subcapsular cataract; ERM epi-retinal membrane; PVD posterior vitreous detachment; RD retinal  detachment; DM diabetes mellitus; DR diabetic retinopathy; NPDR non-proliferative diabetic retinopathy; PDR proliferative diabetic retinopathy; CSME clinically significant macular edema; DME diabetic macular edema; dbh dot blot hemorrhages; CWS cotton wool spot; POAG primary open angle glaucoma; C/D cup-to-disc ratio; HVF humphrey visual field; GVF goldmann visual field; OCT optical coherence tomography; IOP intraocular pressure; BRVO Branch retinal vein occlusion; CRVO central retinal vein occlusion; CRAO central retinal artery occlusion; BRAO branch retinal artery occlusion; RT retinal tear; SB scleral buckle; PPV pars plana vitrectomy; VH Vitreous hemorrhage; PRP panretinal laser photocoagulation; IVK intravitreal kenalog; VMT vitreomacular traction; MH Macular hole;  NVD neovascularization of the disc; NVE neovascularization elsewhere; AREDS age related eye disease study; ARMD age related macular degeneration; POAG primary open angle glaucoma; EBMD epithelial/anterior basement membrane dystrophy; ACIOL anterior chamber intraocular lens; IOL intraocular lens; PCIOL posterior chamber intraocular lens; Phaco/IOL phacoemulsification with intraocular lens placement; PRK photorefractive keratectomy; LASIK laser assisted in situ keratomileusis; HTN hypertension; DM diabetes mellitus; COPD chronic obstructive pulmonary disease

## 2024-02-19 ENCOUNTER — Ambulatory Visit (INDEPENDENT_AMBULATORY_CARE_PROVIDER_SITE_OTHER): Admitting: Ophthalmology

## 2024-02-19 ENCOUNTER — Encounter (INDEPENDENT_AMBULATORY_CARE_PROVIDER_SITE_OTHER): Payer: Self-pay | Admitting: Ophthalmology

## 2024-02-19 DIAGNOSIS — E113213 Type 2 diabetes mellitus with mild nonproliferative diabetic retinopathy with macular edema, bilateral: Secondary | ICD-10-CM | POA: Diagnosis not present

## 2024-02-19 DIAGNOSIS — I10 Essential (primary) hypertension: Secondary | ICD-10-CM

## 2024-02-19 DIAGNOSIS — E113313 Type 2 diabetes mellitus with moderate nonproliferative diabetic retinopathy with macular edema, bilateral: Secondary | ICD-10-CM

## 2024-02-19 DIAGNOSIS — H35033 Hypertensive retinopathy, bilateral: Secondary | ICD-10-CM | POA: Diagnosis not present

## 2024-02-19 DIAGNOSIS — H25813 Combined forms of age-related cataract, bilateral: Secondary | ICD-10-CM

## 2024-02-19 MED ORDER — BEVACIZUMAB CHEMO INJECTION 1.25MG/0.05ML SYRINGE FOR KALEIDOSCOPE
1.2500 mg | INTRAVITREAL | Status: AC | PRN
  Administered 2024-02-19: 1.25 mg via INTRAVITREAL

## 2024-03-20 NOTE — Progress Notes (Signed)
 [facility] - Clinic Note  03/21/2024    CHIEF COMPLAINT Patient presents for Retina Follow Up    HISTORY OF PRESENT ILLNESS: Caroline Walls is a 63 y.o. female who presents to the clinic today for:   HPI     Retina Follow Up   Patient presents with  Diabetic Retinopathy.  In both eyes.  This started 4 weeks ago.  I, the attending physician,  performed the HPI with the patient and updated documentation appropriately.        Comments   Patient here for 4 weeks retina follow up for NPDR OU. Patient states vision the same. No improvement. No eye pain. Last day or 2 eyes been itchy more. Was on prednisone for 7 days for gout.      Last edited by Ronelle Coffee, MD on 03/30/2024  2:22 PM.      Pt states her vision seems the same to her  Referring physician: Rex Castor, MD [address] [place],  [place] [address]  HISTORICAL INFORMATION:  Selected notes from the MEDICAL RECORD NUMBER Referred by Dr. Reyne Cave for eval of DME OD LEE: 10.18.22 Ocular Hx-NPDR OD, Cataracts PMH-DM    CURRENT MEDICATIONS: No current outpatient medications on file. (Ophthalmic Drugs)   No current facility-administered medications for this visit. (Ophthalmic Drugs)   Current Outpatient Medications (Other)  Medication Sig   dapagliflozin propanediol (FARXIGA) 5 MG TABS tablet Take 5 mg by mouth daily.   furosemide  (LASIX ) 40 MG tablet Take 40 mg by mouth daily.   glipiZIDE (GLUCOTROL XL) 2.5 MG 24 hr tablet Take 2.5 mg by mouth daily. Pt takes when needed   hydrALAZINE  (APRESOLINE ) 50 MG tablet Take 50 mg by mouth 4 (four) times daily.   levocetirizine (XYZAL ) 5 MG tablet Take 1 tablet (5 mg total) by mouth every evening.   metoprolol  succinate (TOPROL -XL) 100 MG 24 hr tablet Take 1.5 tablets (150 mg total) by mouth daily.   sacubitril-valsartan (ENTRESTO) 24-26 MG Take 1 tablet by mouth 2 (two) times daily.   Semaglutide,0.25 or 0.5MG /DOS, (OZEMPIC, 0.25 OR 0.5  MG/DOSE,) 2 MG/1.5ML SOPN Inject into the skin. Weekly   spironolactone (ALDACTONE) 12.5 mg TABS tablet Take 12.5 mg by mouth daily.   hydrALAZINE  (APRESOLINE ) 100 MG tablet Take 1 tablet (100 mg total) by mouth every 8 (eight) hours.   Oxcarbazepine (TRILEPTAL) 300 MG tablet Take 300 mg by mouth 2 (two) times daily. (Patient not taking: Reported on 03/21/2024)   PHOSPHATIDYLSERINE PO Take 500 mg by mouth daily. (Patient not taking: Reported on 03/21/2024)   potassium chloride  SA (KLOR-CON ) 20 MEQ tablet Take 1 tablet (20 mEq total) by mouth daily.   Prasterone (DEHYDROEPIANDROSTERONE) POWD Take 5 mg by mouth daily. (Patient not taking: Reported on 03/21/2024)   progesterone (PROMETRIUM) 100 MG capsule Take 100 mg by mouth daily. (Patient not taking: Reported on 03/21/2024)   No current facility-administered medications for this visit. (Other)   REVIEW OF SYSTEMS: ROS   Positive for: Endocrine, Cardiovascular, Eyes, Respiratory Negative for: Constitutional, Gastrointestinal, Neurological, Skin, Genitourinary, Musculoskeletal, HENT, Psychiatric, Allergic/Imm, Heme/Lymph Last edited by Sylvan Evener, COA on 03/21/2024  1:27 PM.      ALLERGIES Allergies  Allergen Reactions   Penicillins Other (See Comments)    Patient get's a yeast infection every time she takes medication. Other reaction(s): Other (See Comments) Patient get's a yeast infection every time she takes medication.    PAST MEDICAL HISTORY Past Medical History:  Diagnosis  Date   CHF (congestive heart failure) (HCC)    Chronic kidney disease    Diabetes mellitus without complication (HCC)    Hypertension    History reviewed. No pertinent surgical history.  FAMILY HISTORY Family History  Problem Relation Age of Onset   Heart failure Brother    Heart failure Other    SOCIAL HISTORY Social History   Tobacco Use   Smoking status: Former   Smokeless tobacco: Never  Advertising account planner   Vaping status: Never Used  Substance Use  Topics   Alcohol use: Not Currently   Drug use: Not Currently       OPHTHALMIC EXAM: Base Eye Exam     Visual Acuity (Snellen - Linear)       Right Left   Dist cc 20/40 -1 20/20 -2   Dist ph cc NI     Correction: Glasses         Tonometry (Tonopen, 1:25 PM)       Right Left   Pressure 15 15         Pupils       Dark Light Shape React APD   Right 3 2 Round Brisk None   Left 3 2 Round Brisk None         Visual Fields (Counting fingers)       Left Right    Full Full         Extraocular Movement       Right Left    Full, Ortho Full, Ortho         Neuro/Psych     Oriented x3: Yes   Mood/Affect: Normal         Dilation     Both eyes: 1.0% Mydriacyl, 2.5% Phenylephrine @ 1:25 PM           Slit Lamp and Fundus Exam     External Exam       Right Left   External Normal Normal         Slit Lamp Exam       Right Left   Lids/Lashes Dermatochalasis - upper lid Dermatochalasis - upper lid   Conjunctiva/Sclera mild melanosis mild melanosis   Cornea arcus, trace PEE, trace EBMD mild arcus   Anterior Chamber deep and clear deep and clear   Iris Round and dilated, No NVI Round and dilated, No NVI   Lens 1-2+ Nuclear sclerosis with early brunescence, 2+ Cortical cataract, +Vacuoles, 1+ Posterior subcapsular cataract 2+ Nuclear sclerosis with early brunescence, 2+ Cortical cataract, +Vacuoles   Anterior Vitreous Vitreous syneresis, Posterior vitreous detachment Vitreous syneresis, Posterior vitreous detachment         Fundus Exam       Right Left   Disc Pink and Sharp Pink and Sharp   C/D Ratio 0.5 0.5   Macula Flat, Blunted foveal reflex, punctate exudates and cystic changes temporal fovea -- slightly improved, scattered MA Flat, blunted foveal reflex, mild focal exudate ST fovea, trace punctate MA, central cystic changes -- slightly improved   Vessels attenuated, Tortuous attenuated, Tortuous   Periphery Attached, No heme Attached, No  heme           Refraction     Wearing Rx       Sphere Cylinder Axis   Right -4.25 Sphere    Left -4.50 +1.75 094    Type: SVL           IMAGING AND PROCEDURES  Imaging and Procedures for 03/21/2024  OCT, Retina - OU - Both Eyes       Right Eye Quality was good. Central Foveal Thickness: 320. Progression has improved. Findings include normal foveal contour, no SRF, intraretinal hyper-reflective material, intraretinal fluid (Persistent cystic changes and IRHM temporal fovea -- slightly improved).   Left Eye Quality was good. Central Foveal Thickness: 248. Progression has improved. Findings include normal foveal contour, no IRF, no SRF, intraretinal hyper-reflective material (Mild Interval improvement in cystic changes and IRHM ST fovea ).   Notes *Images captured and stored on drive  Diagnosis / Impression:  +DME OU OD: Persistent cystic changes and IRHM temporal fovea -- slightly improved OS: Mild interval improvement in cystic changes and IRHM ST fovea   Clinical management:  See below  Abbreviations: NFP - Normal foveal profile. CME - cystoid macular edema. PED - pigment epithelial detachment. IRF - intraretinal fluid. SRF - subretinal fluid. EZ - ellipsoid zone. ERM - epiretinal membrane. ORA - outer retinal atrophy. ORT - outer retinal tubulation. SRHM - subretinal hyper-reflective material. IRHM - intraretinal hyper-reflective material      Intravitreal Injection, Pharmacologic Agent - OD - Right Eye       Time Out 03/21/2024. 2:10 PM. Confirmed correct patient, procedure, site, and patient consented.   Anesthesia Topical anesthesia was used. Anesthetic medications included Lidocaine 2%, Proparacaine 0.5%.   Procedure Preparation included 5% betadine to ocular surface, eyelid speculum. A supplied needle was used.   Injection: 1.25 mg Bevacizumab  1.25mg /0.86ml   Route: Intravitreal, Site: Right Eye   NDC: [id number], Lot: [id number], Expiration date:  04/25/2024   Post-op Post injection exam found visual acuity of at least counting fingers. The patient tolerated the procedure well. There were no complications. The patient received written and verbal post procedure care education.            ASSESSMENT/PLAN:    ICD-10-CM   1. Moderate nonproliferative diabetic retinopathy of both eyes with macular edema associated with type 2 diabetes mellitus (HCC)  E11.3313 OCT, Retina - OU - Both Eyes    Intravitreal Injection, Pharmacologic Agent - OD - Right Eye    Bevacizumab  (AVASTIN ) SOLN 1.25 mg    2. Essential hypertension  I10     3. Hypertensive retinopathy of both eyes  H35.033     4. 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 Both eyes affected by mild nonproliferative diabetic retinopathy with macular edema, associated with type 2 diabetes mellitus (HCC)  Y86.5784      1,2. Moderate non-proliferative diabetic retinopathy, both eyes  - delayed f/u from 3 months to 20 months (08.16.23 to 04.08.25)  - delayed f/u from 4 months to 6 months  - last A1c - 6.9 on 03.14.25 - s/p IVA OD #1 (04.08.25), #2 (05.06.25) - exam shows mild MA OU -- mostly in macula, no NV OU - OCT shows OD: Persistent cystic changes and IRHM temporal fovea -- slightly improved; OS: Mild interval improvement in cystic changes and IRHM ST fovea at 4 wks - FA (02.22.23) shows OD: Trace MA SN macula, mild focal staining / leakage IT to fovea; OS: Rare MA, focal MA with mild leakage superior to fovea - BCVA OD 20/40, OS 20/20 -- stable OU - recommend IVA OD #3 today, 06.05.25 w/ f/u in 4 wks - pt wishes to proceed with injection - RBA of procedure discussed, questions answered - IVA informed consent obtained and signed, 04.08.25 - see procedure note - f/u in 4  weeks -- DFE/OCT, possible injection  3,4. Hypertensive retinopathy OU - discussed importance of tight BP controls - monitor  5. Mixed Cataract OU - The symptoms of cataract,  surgical options, and treatments and risks were discussed with patient. - discussed diagnosis and progression - under the expert management of Dr. Carloyn Chi - clear from a retina standpoint to proceed with cataract surgery when pt and surgeon are ready   Ophthalmic Meds Ordered this visit:  Meds ordered this encounter  Medications   Bevacizumab  (AVASTIN ) SOLN 1.25 mg     Return in about 4 weeks (around 04/18/2024) for f/u NPDR OU, DFE, OCT, Possible Injxn.  There are no Patient Instructions on file for this visit. This document serves as a record of services personally performed by Jeanice Millard, MD, PhD. It was created on their behalf by Eller Gut COT, an ophthalmic technician. The creation of this record is the provider's dictation and/or activities during the visit.    Electronically signed by: Eller Gut COT 06.05.2025 2:25 PM  This document serves as a record of services personally performed by Jeanice Millard, MD, PhD. It was created on their behalf by Morley Arabia. Bevin Bucks, OA an ophthalmic technician. The creation of this record is the provider's dictation and/or activities during the visit.    Electronically signed by: Morley Arabia. Bevin Bucks, OA 03/30/24 2:25 PM  Jeanice Millard, M.D., Ph.D. Diseases & Surgery of the Retina and Vitreous [facility] 03/21/2024   I have reviewed the above documentation for accuracy and completeness, and I agree with the above. Jeanice Millard, M.D., Ph.D. 03/30/24 2:26 PM   Abbreviations: M myopia (nearsighted); A astigmatism; H hyperopia (farsighted); P presbyopia; Mrx spectacle prescription;  CTL contact lenses; OD right eye; OS left eye; OU both eyes  XT exotropia; ET esotropia; PEK punctate epithelial keratitis; PEE punctate epithelial erosions; DES dry eye syndrome; MGD meibomian gland dysfunction; ATs artificial tears; PFAT's preservative free artificial tears; NSC nuclear sclerotic cataract; PSC posterior subcapsular  cataract; ERM epi-retinal membrane; PVD posterior vitreous detachment; RD retinal detachment; DM diabetes mellitus; DR diabetic retinopathy; NPDR non-proliferative diabetic retinopathy; PDR proliferative diabetic retinopathy; CSME clinically significant macular edema; DME diabetic macular edema; dbh dot blot hemorrhages; CWS cotton wool spot; POAG primary open angle glaucoma; C/D cup-to-disc ratio; HVF humphrey visual field; GVF goldmann visual field; OCT optical coherence tomography; IOP intraocular pressure; BRVO Branch retinal vein occlusion; CRVO central retinal vein occlusion; CRAO central retinal artery occlusion; BRAO branch retinal artery occlusion; RT retinal tear; SB scleral buckle; PPV pars plana vitrectomy; VH Vitreous hemorrhage; PRP panretinal laser photocoagulation; IVK intravitreal kenalog; VMT vitreomacular traction; MH Macular hole;  NVD neovascularization of the disc; NVE neovascularization elsewhere; AREDS age related eye disease study; ARMD age related macular degeneration; POAG primary open angle glaucoma; EBMD epithelial/anterior basement membrane dystrophy; ACIOL anterior chamber intraocular lens; IOL intraocular lens; PCIOL posterior chamber intraocular lens; Phaco/IOL phacoemulsification with intraocular lens placement; PRK photorefractive keratectomy; LASIK laser assisted in situ keratomileusis; HTN hypertension; DM diabetes mellitus; COPD chronic obstructive pulmonary disease

## 2024-03-21 ENCOUNTER — Encounter (INDEPENDENT_AMBULATORY_CARE_PROVIDER_SITE_OTHER): Payer: Self-pay | Admitting: Ophthalmology

## 2024-03-21 ENCOUNTER — Ambulatory Visit (INDEPENDENT_AMBULATORY_CARE_PROVIDER_SITE_OTHER): Admitting: Ophthalmology

## 2024-03-21 DIAGNOSIS — E113213 Type 2 diabetes mellitus with mild nonproliferative diabetic retinopathy with macular edema, bilateral: Secondary | ICD-10-CM

## 2024-03-21 DIAGNOSIS — H25813 Combined forms of age-related cataract, bilateral: Secondary | ICD-10-CM | POA: Diagnosis not present

## 2024-03-21 DIAGNOSIS — I10 Essential (primary) hypertension: Secondary | ICD-10-CM

## 2024-03-21 DIAGNOSIS — E113313 Type 2 diabetes mellitus with moderate nonproliferative diabetic retinopathy with macular edema, bilateral: Secondary | ICD-10-CM | POA: Diagnosis not present

## 2024-03-21 DIAGNOSIS — H35033 Hypertensive retinopathy, bilateral: Secondary | ICD-10-CM

## 2024-03-30 ENCOUNTER — Encounter (INDEPENDENT_AMBULATORY_CARE_PROVIDER_SITE_OTHER): Payer: Self-pay | Admitting: Ophthalmology

## 2024-03-30 MED ORDER — BEVACIZUMAB CHEMO INJECTION 1.25MG/0.05ML SYRINGE FOR KALEIDOSCOPE
1.2500 mg | INTRAVITREAL | Status: AC | PRN
  Administered 2024-03-30: 1.25 mg via INTRAVITREAL

## 2024-04-15 NOTE — Progress Notes (Incomplete)
 [facility] - Clinic Note  04/21/2024    CHIEF COMPLAINT Patient presents for No chief complaint on file.    HISTORY OF PRESENT ILLNESS: Caroline Walls is a 63 y.o. female who presents to the clinic today for:      Pt states her vision seems the same to her  Referring physician: Sherial Bail, MD [address] [place],  [place] [address]  HISTORICAL INFORMATION:  Selected notes from the MEDICAL RECORD NUMBER Referred by Dr. Lelon for eval of DME OD LEE: 10.18.22 Ocular Hx-NPDR OD, Cataracts PMH-DM    CURRENT MEDICATIONS: No current outpatient medications on file. (Ophthalmic Drugs)   No current facility-administered medications for this visit. (Ophthalmic Drugs)   Current Outpatient Medications (Other)  Medication Sig   dapagliflozin propanediol (FARXIGA) 5 MG TABS tablet Take 5 mg by mouth daily.   furosemide  (LASIX ) 40 MG tablet Take 40 mg by mouth daily.   glipiZIDE (GLUCOTROL XL) 2.5 MG 24 hr tablet Take 2.5 mg by mouth daily. Pt takes when needed   hydrALAZINE  (APRESOLINE ) 100 MG tablet Take 1 tablet (100 mg total) by mouth every 8 (eight) hours.   hydrALAZINE  (APRESOLINE ) 50 MG tablet Take 50 mg by mouth 4 (four) times daily.   levocetirizine (XYZAL ) 5 MG tablet Take 1 tablet (5 mg total) by mouth every evening.   metoprolol  succinate (TOPROL -XL) 100 MG 24 hr tablet Take 1.5 tablets (150 mg total) by mouth daily.   Oxcarbazepine (TRILEPTAL) 300 MG tablet Take 300 mg by mouth 2 (two) times daily. (Patient not taking: Reported on 03/21/2024)   PHOSPHATIDYLSERINE PO Take 500 mg by mouth daily. (Patient not taking: Reported on 03/21/2024)   potassium chloride  SA (KLOR-CON ) 20 MEQ tablet Take 1 tablet (20 mEq total) by mouth daily.   Prasterone (DEHYDROEPIANDROSTERONE) POWD Take 5 mg by mouth daily. (Patient not taking: Reported on 03/21/2024)   progesterone (PROMETRIUM) 100 MG capsule Take 100 mg by mouth daily. (Patient not taking: Reported on  03/21/2024)   sacubitril-valsartan (ENTRESTO) 24-26 MG Take 1 tablet by mouth 2 (two) times daily.   Semaglutide,0.25 or 0.5MG /DOS, (OZEMPIC, 0.25 OR 0.5 MG/DOSE,) 2 MG/1.5ML SOPN Inject into the skin. Weekly   spironolactone (ALDACTONE) 12.5 mg TABS tablet Take 12.5 mg by mouth daily.   No current facility-administered medications for this visit. (Other)   REVIEW OF SYSTEMS:    ALLERGIES Allergies  Allergen Reactions   Penicillins Other (See Comments)    Patient get's a yeast infection every time she takes medication. Other reaction(s): Other (See Comments) Patient get's a yeast infection every time she takes medication.    PAST MEDICAL HISTORY Past Medical History:  Diagnosis Date   CHF (congestive heart failure) (HCC)    Chronic kidney disease    Diabetes mellitus without complication (HCC)    Hypertension    No past surgical history on file.  FAMILY HISTORY Family History  Problem Relation Age of Onset   Heart failure Brother    Heart failure Other    SOCIAL HISTORY Social History   Tobacco Use   Smoking status: Former   Smokeless tobacco: Never  Vaping Use   Vaping status: Never Used  Substance Use Topics   Alcohol use: Not Currently   Drug use: Not Currently       OPHTHALMIC EXAM: Not recorded    IMAGING AND PROCEDURES  Imaging and Procedures for 04/21/2024          ASSESSMENT/PLAN:    ICD-10-CM  1. Moderate nonproliferative diabetic retinopathy of both eyes with macular edema associated with type 2 diabetes mellitus (HCC)  Z88.6686     2. Essential hypertension  I10     3. Hypertensive retinopathy of both eyes  H35.033     4. 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 Both eyes affected by mild nonproliferative diabetic retinopathy with macular edema, associated with type 2 diabetes mellitus (HCC)  Z88.6786       1,2. Moderate non-proliferative diabetic retinopathy, both eyes  - delayed f/u from 3 months to 20  months (08.16.23 to 04.08.25)  - delayed f/u from 4 months to 6 months  - last A1c - 6.9 on 03.14.25 - s/p IVA OD #1 (04.08.25), #2 (05.06.25), #3 (06.05.25) - exam shows mild MA OU -- mostly in macula, no NV OU - OCT shows OD: Persistent cystic changes and IRHM temporal fovea -- slightly improved; OS: Mild interval improvement in cystic changes and IRHM ST fovea at 4 wks - FA (02.22.23) shows OD: Trace MA SN macula, mild focal staining / leakage IT to fovea; OS: Rare MA, focal MA with mild leakage superior to fovea - BCVA OD 20/40, OS 20/20 -- stable OU - recommend IVA OD #4 today, 07.07.25 w/ f/u in 4 wks - pt wishes to proceed with injection - RBA of procedure discussed, questions answered - IVA informed consent obtained and signed, 04.08.25 - see procedure note - f/u in 4 weeks -- DFE/OCT, possible injection  3,4. Hypertensive retinopathy OU - discussed importance of tight BP controls - monitor  5. Mixed Cataract OU - The symptoms of cataract, surgical options, and treatments and risks were discussed with patient. - discussed diagnosis and progression - under the expert management of Dr. Fleeta - clear from a retina standpoint to proceed with cataract surgery when pt and surgeon are ready   Ophthalmic Meds Ordered this visit:  No orders of the defined types were placed in this encounter.    No follow-ups on file.  There are no Patient Instructions on file for this visit.  This document serves as a record of services personally performed by Redell JUDITHANN Hans, MD, PhD. It was created on their behalf by Avelina Pereyra, COA an ophthalmic technician. The creation of this record is the provider's dictation and/or activities during the visit.   Electronically signed by: Avelina GORMAN Pereyra, COT  04/15/24  3:38 PM    Redell JUDITHANN Hans, M.D., Ph.D. Diseases & Surgery of the Retina and Vitreous [facility]     Abbreviations: M myopia (nearsighted); A astigmatism; H  hyperopia (farsighted); P presbyopia; Mrx spectacle prescription;  CTL contact lenses; OD right eye; OS left eye; OU both eyes  XT exotropia; ET esotropia; PEK punctate epithelial keratitis; PEE punctate epithelial erosions; DES dry eye syndrome; MGD meibomian gland dysfunction; ATs artificial tears; PFAT's preservative free artificial tears; NSC nuclear sclerotic cataract; PSC posterior subcapsular cataract; ERM epi-retinal membrane; PVD posterior vitreous detachment; RD retinal detachment; DM diabetes mellitus; DR diabetic retinopathy; NPDR non-proliferative diabetic retinopathy; PDR proliferative diabetic retinopathy; CSME clinically significant macular edema; DME diabetic macular edema; dbh dot blot hemorrhages; CWS cotton wool spot; POAG primary open angle glaucoma; C/D cup-to-disc ratio; HVF humphrey visual field; GVF goldmann visual field; OCT optical coherence tomography; IOP intraocular pressure; BRVO Branch retinal vein occlusion; CRVO central retinal vein occlusion; CRAO central retinal artery occlusion; BRAO branch retinal artery occlusion; RT retinal tear; SB scleral  buckle; PPV pars plana vitrectomy; VH Vitreous hemorrhage; PRP panretinal laser photocoagulation; IVK intravitreal kenalog; VMT vitreomacular traction; MH Macular hole;  NVD neovascularization of the disc; NVE neovascularization elsewhere; AREDS age related eye disease study; ARMD age related macular degeneration; POAG primary open angle glaucoma; EBMD epithelial/anterior basement membrane dystrophy; ACIOL anterior chamber intraocular lens; IOL intraocular lens; PCIOL posterior chamber intraocular lens; Phaco/IOL phacoemulsification with intraocular lens placement; PRK photorefractive keratectomy; LASIK laser assisted in situ keratomileusis; HTN hypertension; DM diabetes mellitus; COPD chronic obstructive pulmonary disease

## 2024-04-21 ENCOUNTER — Encounter (INDEPENDENT_AMBULATORY_CARE_PROVIDER_SITE_OTHER): Payer: Self-pay | Admitting: Ophthalmology

## 2024-04-21 ENCOUNTER — Ambulatory Visit (INDEPENDENT_AMBULATORY_CARE_PROVIDER_SITE_OTHER): Admitting: Ophthalmology

## 2024-04-21 DIAGNOSIS — I10 Essential (primary) hypertension: Secondary | ICD-10-CM

## 2024-04-21 DIAGNOSIS — H25813 Combined forms of age-related cataract, bilateral: Secondary | ICD-10-CM | POA: Diagnosis not present

## 2024-04-21 DIAGNOSIS — E113313 Type 2 diabetes mellitus with moderate nonproliferative diabetic retinopathy with macular edema, bilateral: Secondary | ICD-10-CM | POA: Diagnosis not present

## 2024-04-21 DIAGNOSIS — H35033 Hypertensive retinopathy, bilateral: Secondary | ICD-10-CM | POA: Diagnosis not present

## 2024-04-21 DIAGNOSIS — E113213 Type 2 diabetes mellitus with mild nonproliferative diabetic retinopathy with macular edema, bilateral: Secondary | ICD-10-CM

## 2024-04-21 MED ORDER — BEVACIZUMAB CHEMO INJECTION 1.25MG/0.05ML SYRINGE FOR KALEIDOSCOPE
1.2500 mg | INTRAVITREAL | Status: AC | PRN
  Administered 2024-04-21: 1.25 mg via INTRAVITREAL

## 2024-04-21 NOTE — Progress Notes (Signed)
 [facility] - Clinic Note  04/21/2024    CHIEF COMPLAINT Patient presents for Retina Follow Up  HISTORY OF PRESENT ILLNESS: Caroline Walls is a 63 y.o. female who presents to the clinic today for:   HPI     Retina Follow Up   Patient presents with  Diabetic Retinopathy.  In both eyes.  This started 4 weeks ago.  Duration of 4 weeks.  Since onset it is stable.  I, the attending physician,  performed the HPI with the patient and updated documentation appropriately.        Comments   4 week retina follow up NPDR OU and IVA OD pt is reporting no vision changes noticed she denies any flashes or floaters last reading 103      Last edited by Valdemar Rogue, MD on 04/21/2024  4:54 PM.    Pt states her vision is occasionally blurry, she is experiencing dry eye issues  Referring physician: Sherial Bail, MD [address] [place],  [place] [address]  HISTORICAL INFORMATION:  Selected notes from the MEDICAL RECORD NUMBER Referred by Dr. Lelon for eval of DME OD LEE: 10.18.22 Ocular Hx-NPDR OD, Cataracts PMH-DM    CURRENT MEDICATIONS: No current outpatient medications on file. (Ophthalmic Drugs)   No current facility-administered medications for this visit. (Ophthalmic Drugs)   Current Outpatient Medications (Other)  Medication Sig   dapagliflozin propanediol (FARXIGA) 5 MG TABS tablet Take 5 mg by mouth daily.   furosemide  (LASIX ) 40 MG tablet Take 40 mg by mouth daily.   glipiZIDE (GLUCOTROL XL) 2.5 MG 24 hr tablet Take 2.5 mg by mouth daily. Pt takes when needed   hydrALAZINE  (APRESOLINE ) 100 MG tablet Take 1 tablet (100 mg total) by mouth every 8 (eight) hours.   hydrALAZINE  (APRESOLINE ) 50 MG tablet Take 50 mg by mouth 4 (four) times daily.   levocetirizine (XYZAL ) 5 MG tablet Take 1 tablet (5 mg total) by mouth every evening.   metoprolol  succinate (TOPROL -XL) 100 MG 24 hr tablet Take 1.5 tablets (150 mg total) by mouth daily.   Oxcarbazepine  (TRILEPTAL) 300 MG tablet Take 300 mg by mouth 2 (two) times daily. (Patient not taking: Reported on 03/21/2024)   PHOSPHATIDYLSERINE PO Take 500 mg by mouth daily. (Patient not taking: Reported on 03/21/2024)   potassium chloride  SA (KLOR-CON ) 20 MEQ tablet Take 1 tablet (20 mEq total) by mouth daily.   Prasterone (DEHYDROEPIANDROSTERONE) POWD Take 5 mg by mouth daily. (Patient not taking: Reported on 03/21/2024)   progesterone (PROMETRIUM) 100 MG capsule Take 100 mg by mouth daily. (Patient not taking: Reported on 03/21/2024)   sacubitril-valsartan (ENTRESTO) 24-26 MG Take 1 tablet by mouth 2 (two) times daily.   Semaglutide,0.25 or 0.5MG /DOS, (OZEMPIC, 0.25 OR 0.5 MG/DOSE,) 2 MG/1.5ML SOPN Inject into the skin. Weekly   spironolactone (ALDACTONE) 12.5 mg TABS tablet Take 12.5 mg by mouth daily.   No current facility-administered medications for this visit. (Other)   REVIEW OF SYSTEMS: ROS   Positive for: Endocrine, Cardiovascular, Eyes, Respiratory Negative for: Constitutional, Gastrointestinal, Neurological, Skin, Genitourinary, Musculoskeletal, HENT, Psychiatric, Allergic/Imm, Heme/Lymph Last edited by Resa Delon ORN, COT on 04/21/2024  1:59 PM.       ALLERGIES Allergies  Allergen Reactions   Penicillins Other (See Comments)    Patient get's a yeast infection every time she takes medication. Other reaction(s): Other (See Comments) Patient get's a yeast infection every time she takes medication.    PAST MEDICAL HISTORY Past Medical History:  Diagnosis Date   CHF (congestive heart failure) (HCC)    Chronic kidney disease    Diabetes mellitus without complication (HCC)    Hypertension    History reviewed. No pertinent surgical history.  FAMILY HISTORY Family History  Problem Relation Age of Onset   Heart failure Brother    Heart failure Other    SOCIAL HISTORY Social History   Tobacco Use   Smoking status: Former   Smokeless tobacco: Never  Advertising account planner   Vaping  status: Never Used  Substance Use Topics   Alcohol use: Not Currently   Drug use: Not Currently       OPHTHALMIC EXAM: Base Eye Exam     Visual Acuity (Snellen - Linear)       Right Left   Dist cc 20/50 20/30 -1   Dist ph cc NI NI         Tonometry (Tonopen, 2:04 PM)       Right Left   Pressure 14 17         Pupils       Pupils Dark Light Shape React APD   Right PERRL 3 2 Round Brisk None   Left PERRL 3 2 Round Brisk None         Visual Fields       Left Right    Full Full         Extraocular Movement       Right Left    Full, Ortho Full, Ortho         Neuro/Psych     Oriented x3: Yes   Mood/Affect: Normal           Slit Lamp and Fundus Exam     External Exam       Right Left   External Normal Normal         Slit Lamp Exam       Right Left   Lids/Lashes Dermatochalasis - upper lid Dermatochalasis - upper lid   Conjunctiva/Sclera mild melanosis mild melanosis   Cornea arcus, trace PEE, trace EBMD mild arcus   Anterior Chamber deep and clear deep and clear   Iris Round and dilated, No NVI Round and dilated, No NVI   Lens 2+ Nuclear sclerosis with early brunescence, 2-3+ Cortical cataract, +Vacuoles, 1+ Posterior subcapsular cataract 2+ Nuclear sclerosis with early brunescence, 2+ Cortical cataract, +Vacuoles   Anterior Vitreous Vitreous syneresis, Posterior vitreous detachment Vitreous syneresis, Posterior vitreous detachment         Fundus Exam       Right Left   Disc Pink and Sharp Pink and Sharp   C/D Ratio 0.5 0.5   Macula Flat, Blunted foveal reflex, punctate exudates and cystic changes temporal fovea -- slightly improved, scattered MA Flat, blunted foveal reflex, mild focal exudate ST fovea, trace punctate MA, central cystic changes -- slightly improved   Vessels attenuated, Tortuous attenuated, Tortuous   Periphery Attached, No heme Attached, No heme           Refraction     Wearing Rx       Sphere Cylinder  Axis   Right -4.25 Sphere    Left -4.50 +1.75 094    Type: SVL           IMAGING AND PROCEDURES  Imaging and Procedures for 04/21/2024  OCT, Retina - OU - Both Eyes       Right Eye Quality was good. Central Foveal Thickness: 319. Progression has improved. Findings  include normal foveal contour, no SRF, intraretinal hyper-reflective material, intraretinal fluid (Persistent cystic changes and IRHM temporal fovea -- slightly improved).   Left Eye Quality was good. Central Foveal Thickness: 244. Progression has improved. Findings include normal foveal contour, no IRF, no SRF, intraretinal hyper-reflective material (Mild Interval improvement in cystic changes and IRHM ST fovea ).   Notes *Images captured and stored on drive  Diagnosis / Impression:  +DME OU OD: Persistent cystic changes and IRHM temporal fovea -- slightly improved OS: Mild interval improvement in cystic changes and IRHM ST fovea   Clinical management:  See below  Abbreviations: NFP - Normal foveal profile. CME - cystoid macular edema. PED - pigment epithelial detachment. IRF - intraretinal fluid. SRF - subretinal fluid. EZ - ellipsoid zone. ERM - epiretinal membrane. ORA - outer retinal atrophy. ORT - outer retinal tubulation. SRHM - subretinal hyper-reflective material. IRHM - intraretinal hyper-reflective material      Intravitreal Injection, Pharmacologic Agent - OD - Right Eye       Time Out 04/21/2024. 2:42 PM. Confirmed correct patient, procedure, site, and patient consented.   Anesthesia Topical anesthesia was used. Anesthetic medications included Lidocaine 2%, Proparacaine 0.5%.   Procedure Preparation included 5% betadine to ocular surface, eyelid speculum. A supplied needle was used.   Injection: 1.25 mg Bevacizumab  1.25mg /0.23ml   Route: Intravitreal, Site: Right Eye   NDC: [id number], Lot: [id number], Expiration date: 05/12/2024   Post-op Post injection exam found visual acuity of at least  counting fingers. The patient tolerated the procedure well. There were no complications. The patient received written and verbal post procedure care education.             ASSESSMENT/PLAN:    ICD-10-CM   1. Moderate nonproliferative diabetic retinopathy of both eyes with macular edema associated with type 2 diabetes mellitus (HCC)  E11.3313 OCT, Retina - OU - Both Eyes    Intravitreal Injection, Pharmacologic Agent - OD - Right Eye    Bevacizumab  (AVASTIN ) SOLN 1.25 mg    2. Essential hypertension  I10     3. Hypertensive retinopathy of both eyes  H35.033     4. 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 Both eyes affected by mild nonproliferative diabetic retinopathy with macular edema, associated with type 2 diabetes mellitus (HCC)  Z88.6786       1,2. Moderate non-proliferative diabetic retinopathy, both eyes  - delayed f/u from 3 months to 20 months (08.16.23 to 04.08.25)  - delayed f/u from 4 months to 6 months  - last A1c - 6.9 on 03.14.25 - s/p IVA OD #1 (04.08.25), #2 (05.06.25), #3 (06.05.25) - exam shows mild MA OU -- mostly in macula, no NV OU - OCT shows OD: Persistent cystic changes and IRHM temporal fovea -- slightly improved; OS: Mild interval improvement in cystic changes and IRHM ST fovea at 4 wks **discussed decreased efficacy / resistance to Avastin  and potential benefit of switching from Avastin  to Eylea** - FA (02.22.23) shows OD: Trace MA SN macula, mild focal staining / leakage IT to fovea; OS: Rare MA, focal MA with mild leakage superior to fovea - BCVA OD decreased to 20/50 from 20/40, OS: decreased to 20/30 from 20/20  - recommend IVA OD #4 today, 07.07.25 w/ f/u in 4 wks - pt wishes to proceed with injection - RBA of procedure discussed, questions answered - IVA informed consent obtained and signed, 04.08.25 - see procedure note - will check Eylea  for next visit - f/u in 4 weeks -- DFE/OCT, possible injection  3,4.  Hypertensive retinopathy OU - discussed importance of tight BP controls - monitor  5. Mixed Cataract OU - The symptoms of cataract, surgical options, and treatments and risks were discussed with patient. - discussed diagnosis and progression - under the expert management of Dr. Fleeta - clear from a retina standpoint to proceed with cataract surgery when pt and surgeon are ready   Ophthalmic Meds Ordered this visit:  Meds ordered this encounter  Medications   Bevacizumab  (AVASTIN ) SOLN 1.25 mg     Return in about 4 weeks (around 05/19/2024) for f/u NPDR OU, DFE, OCT, Possible Injxn.  There are no Patient Instructions on file for this visit.  This document serves as a record of services personally performed by Redell JUDITHANN Hans, MD, PhD. It was created on their behalf by Avelina Pereyra, COA an ophthalmic technician. The creation of this record is the provider's dictation and/or activities during the visit.   Electronically signed by: Avelina GORMAN Pereyra, COT  04/21/24  4:56 PM   This document serves as a record of services personally performed by Redell JUDITHANN Hans, MD, PhD. It was created on their behalf by Alan PARAS. Delores, OA an ophthalmic technician. The creation of this record is the provider's dictation and/or activities during the visit.    Electronically signed by: Alan PARAS. Delores, OA 04/21/24 4:56 PM  Redell JUDITHANN Hans, M.D., Ph.D. Diseases & Surgery of the Retina and Vitreous [facility]  I have reviewed the above documentation for accuracy and completeness, and I agree with the above. Redell JUDITHANN Hans, M.D., Ph.D. 04/21/24 4:57 PM   Abbreviations: M myopia (nearsighted); A astigmatism; H hyperopia (farsighted); P presbyopia; Mrx spectacle prescription;  CTL contact lenses; OD right eye; OS left eye; OU both eyes  XT exotropia; ET esotropia; PEK punctate epithelial keratitis; PEE punctate epithelial erosions; DES dry eye syndrome; MGD meibomian gland dysfunction; ATs  artificial tears; PFAT's preservative free artificial tears; NSC nuclear sclerotic cataract; PSC posterior subcapsular cataract; ERM epi-retinal membrane; PVD posterior vitreous detachment; RD retinal detachment; DM diabetes mellitus; DR diabetic retinopathy; NPDR non-proliferative diabetic retinopathy; PDR proliferative diabetic retinopathy; CSME clinically significant macular edema; DME diabetic macular edema; dbh dot blot hemorrhages; CWS cotton wool spot; POAG primary open angle glaucoma; C/D cup-to-disc ratio; HVF humphrey visual field; GVF goldmann visual field; OCT optical coherence tomography; IOP intraocular pressure; BRVO Branch retinal vein occlusion; CRVO central retinal vein occlusion; CRAO central retinal artery occlusion; BRAO branch retinal artery occlusion; RT retinal tear; SB scleral buckle; PPV pars plana vitrectomy; VH Vitreous hemorrhage; PRP panretinal laser photocoagulation; IVK intravitreal kenalog; VMT vitreomacular traction; MH Macular hole;  NVD neovascularization of the disc; NVE neovascularization elsewhere; AREDS age related eye disease study; ARMD age related macular degeneration; POAG primary open angle glaucoma; EBMD epithelial/anterior basement membrane dystrophy; ACIOL anterior chamber intraocular lens; IOL intraocular lens; PCIOL posterior chamber intraocular lens; Phaco/IOL phacoemulsification with intraocular lens placement; PRK photorefractive keratectomy; LASIK laser assisted in situ keratomileusis; HTN hypertension; DM diabetes mellitus; COPD chronic obstructive pulmonary disease

## 2024-05-13 NOTE — Progress Notes (Shared)
 [facility] - Clinic Note  05/27/2024    CHIEF COMPLAINT Patient presents for No chief complaint on file.  HISTORY OF PRESENT ILLNESS: Caroline Walls is a 63 y.o. female who presents to the clinic today for:    Pt states her vision is occasionally blurry, she is experiencing dry eye issues  Referring physician: Sherial Bail, MD [address] [place],  [place] [address]  HISTORICAL INFORMATION:  Selected notes from the MEDICAL RECORD NUMBER Referred by Dr. Lelon for eval of DME OD LEE: 10.18.22 Ocular Hx-NPDR OD, Cataracts PMH-DM    CURRENT MEDICATIONS: No current outpatient medications on file. (Ophthalmic Drugs)   No current facility-administered medications for this visit. (Ophthalmic Drugs)   Current Outpatient Medications (Other)  Medication Sig   dapagliflozin propanediol (FARXIGA) 5 MG TABS tablet Take 5 mg by mouth daily.   furosemide  (LASIX ) 40 MG tablet Take 40 mg by mouth daily.   glipiZIDE (GLUCOTROL XL) 2.5 MG 24 hr tablet Take 2.5 mg by mouth daily. Pt takes when needed   hydrALAZINE  (APRESOLINE ) 100 MG tablet Take 1 tablet (100 mg total) by mouth every 8 (eight) hours.   hydrALAZINE  (APRESOLINE ) 50 MG tablet Take 50 mg by mouth 4 (four) times daily.   levocetirizine (XYZAL ) 5 MG tablet Take 1 tablet (5 mg total) by mouth every evening.   metoprolol  succinate (TOPROL -XL) 100 MG 24 hr tablet Take 1.5 tablets (150 mg total) by mouth daily.   Oxcarbazepine (TRILEPTAL) 300 MG tablet Take 300 mg by mouth 2 (two) times daily. (Patient not taking: Reported on 03/21/2024)   PHOSPHATIDYLSERINE PO Take 500 mg by mouth daily. (Patient not taking: Reported on 03/21/2024)   potassium chloride  SA (KLOR-CON ) 20 MEQ tablet Take 1 tablet (20 mEq total) by mouth daily.   Prasterone (DEHYDROEPIANDROSTERONE) POWD Take 5 mg by mouth daily. (Patient not taking: Reported on 03/21/2024)   progesterone (PROMETRIUM) 100 MG capsule Take 100 mg by mouth daily.  (Patient not taking: Reported on 03/21/2024)   sacubitril-valsartan (ENTRESTO) 24-26 MG Take 1 tablet by mouth 2 (two) times daily.   Semaglutide,0.25 or 0.5MG /DOS, (OZEMPIC, 0.25 OR 0.5 MG/DOSE,) 2 MG/1.5ML SOPN Inject into the skin. Weekly   spironolactone (ALDACTONE) 12.5 mg TABS tablet Take 12.5 mg by mouth daily.   No current facility-administered medications for this visit. (Other)   REVIEW OF SYSTEMS:     ALLERGIES Allergies  Allergen Reactions   Penicillins Other (See Comments)    Patient get's a yeast infection every time she takes medication. Other reaction(s): Other (See Comments) Patient get's a yeast infection every time she takes medication.    PAST MEDICAL HISTORY Past Medical History:  Diagnosis Date   CHF (congestive heart failure) (HCC)    Chronic kidney disease    Diabetes mellitus without complication (HCC)    Hypertension    No past surgical history on file.  FAMILY HISTORY Family History  Problem Relation Age of Onset   Heart failure Brother    Heart failure Other    SOCIAL HISTORY Social History   Tobacco Use   Smoking status: Former   Smokeless tobacco: Never  Vaping Use   Vaping status: Never Used  Substance Use Topics   Alcohol use: Not Currently   Drug use: Not Currently       OPHTHALMIC EXAM: Not recorded    IMAGING AND PROCEDURES  Imaging and Procedures for 05/27/2024           ASSESSMENT/PLAN:  ICD-10-CM   1. Moderate nonproliferative diabetic retinopathy of both eyes with macular edema associated with type 2 diabetes mellitus (HCC)  Z88.6686     2. Both eyes affected by mild nonproliferative diabetic retinopathy with macular edema, associated with type 2 diabetes mellitus (HCC)  Z88.6786     3. Essential hypertension  I10     4. Hypertensive retinopathy of both eyes  H35.033     5. Combined forms of age-related cataract of both eyes  H25.813        1,2. Moderate non-proliferative diabetic retinopathy, both  eyes  - delayed f/u from 3 months to 20 months (08.16.23 to 04.08.25)  - delayed f/u from 4 months to 6 months  - last A1c - 6.9 on 03.14.25 - s/p IVA OD #1 (04.08.25), #2 (05.06.25), #3 (06.05.25) - exam shows mild MA OU -- mostly in macula, no NV OU - OCT shows OD: Persistent cystic changes and IRHM temporal fovea -- slightly improved; OS: Mild interval improvement in cystic changes and IRHM ST fovea at 4 wks **discussed decreased efficacy / resistance to Avastin  and potential benefit of switching from Avastin  to Eylea** - FA (02.22.23) shows OD: Trace MA SN macula, mild focal staining / leakage IT to fovea; OS: Rare MA, focal MA with mild leakage superior to fovea - BCVA OD decreased to 20/50 from 20/40, OS: decreased to 20/30 from 20/20  - recommend IVA OD #4 today, 07.07.25 w/ f/u in 4 wks - pt wishes to proceed with injection - RBA of procedure discussed, questions answered - IVA informed consent obtained and signed, 04.08.25 - see procedure note - will check Eylea for next visit - f/u in 4 weeks -- DFE/OCT, possible injeciton  3,4. Hypertensive retinopathy OU - discussed importance of tight BP controls - monitor  5. Mixed Cataract OU - The symptoms of cataract, surgical options, and treatments and risks were discussed with patient. - discussed diagnosis and progression - under the expert management of Dr. Fleeta - clear from a retina standpoint to proceed with cataract surgery when pt and surgeon are ready   Ophthalmic Meds Ordered this visit:  No orders of the defined types were placed in this encounter.    No follow-ups on file.  There are no Patient Instructions on file for this visit.  This document serves as a record of services personally performed by Redell JUDITHANN Hans, MD, PhD. It was created on their behalf by Avelina Pereyra, COA an ophthalmic technician. The creation of this record is the provider's dictation and/or activities during the visit.   Electronically signed  by: Avelina GORMAN Pereyra, COT  05/13/24  10:55 AM     Redell JUDITHANN Hans, M.D., Ph.D. Diseases & Surgery of the Retina and Vitreous [facility]   Abbreviations: M myopia (nearsighted); A astigmatism; H hyperopia (farsighted); P presbyopia; Mrx spectacle prescription;  CTL contact lenses; OD right eye; OS left eye; OU both eyes  XT exotropia; ET esotropia; PEK punctate epithelial keratitis; PEE punctate epithelial erosions; DES dry eye syndrome; MGD meibomian gland dysfunction; ATs artificial tears; PFAT's preservative free artificial tears; NSC nuclear sclerotic cataract; PSC posterior subcapsular cataract; ERM epi-retinal membrane; PVD posterior vitreous detachment; RD retinal detachment; DM diabetes mellitus; DR diabetic retinopathy; NPDR non-proliferative diabetic retinopathy; PDR proliferative diabetic retinopathy; CSME clinically significant macular edema; DME diabetic macular edema; dbh dot blot hemorrhages; CWS cotton wool spot; POAG primary open angle glaucoma; C/D cup-to-disc ratio; HVF humphrey visual field; GVF goldmann visual field;  OCT optical coherence tomography; IOP intraocular pressure; BRVO Branch retinal vein occlusion; CRVO central retinal vein occlusion; CRAO central retinal artery occlusion; BRAO branch retinal artery occlusion; RT retinal tear; SB scleral buckle; PPV pars plana vitrectomy; VH Vitreous hemorrhage; PRP panretinal laser photocoagulation; IVK intravitreal kenalog; VMT vitreomacular traction; MH Macular hole;  NVD neovascularization of the disc; NVE neovascularization elsewhere; AREDS age related eye disease study; ARMD age related macular degeneration; POAG primary open angle glaucoma; EBMD epithelial/anterior basement membrane dystrophy; ACIOL anterior chamber intraocular lens; IOL intraocular lens; PCIOL posterior chamber intraocular lens; Phaco/IOL phacoemulsification with intraocular lens placement; PRK photorefractive keratectomy; LASIK laser assisted  in situ keratomileusis; HTN hypertension; DM diabetes mellitus; COPD chronic obstructive pulmonary disease

## 2024-05-20 ENCOUNTER — Encounter (INDEPENDENT_AMBULATORY_CARE_PROVIDER_SITE_OTHER): Admitting: Ophthalmology

## 2024-05-27 ENCOUNTER — Encounter (INDEPENDENT_AMBULATORY_CARE_PROVIDER_SITE_OTHER): Admitting: Ophthalmology

## 2024-05-27 ENCOUNTER — Encounter (INDEPENDENT_AMBULATORY_CARE_PROVIDER_SITE_OTHER): Payer: Self-pay

## 2024-05-27 DIAGNOSIS — E113313 Type 2 diabetes mellitus with moderate nonproliferative diabetic retinopathy with macular edema, bilateral: Secondary | ICD-10-CM

## 2024-05-27 DIAGNOSIS — H35033 Hypertensive retinopathy, bilateral: Secondary | ICD-10-CM

## 2024-05-27 DIAGNOSIS — I10 Essential (primary) hypertension: Secondary | ICD-10-CM

## 2024-05-27 DIAGNOSIS — E113213 Type 2 diabetes mellitus with mild nonproliferative diabetic retinopathy with macular edema, bilateral: Secondary | ICD-10-CM

## 2024-05-27 DIAGNOSIS — H25813 Combined forms of age-related cataract, bilateral: Secondary | ICD-10-CM
